# Patient Record
Sex: Female | Born: 1970
Health system: Southern US, Community
[De-identification: ages and names within clinical notes are randomized; demographics above are authoritative.]

## PROBLEM LIST (undated history)

## (undated) DIAGNOSIS — Z8489 Family history of other specified conditions: Secondary | ICD-10-CM

## (undated) DIAGNOSIS — R51 Headache: Secondary | ICD-10-CM

## (undated) DIAGNOSIS — E0590 Thyrotoxicosis, unspecified without thyrotoxic crisis or storm: Secondary | ICD-10-CM

## (undated) DIAGNOSIS — I10 Essential (primary) hypertension: Secondary | ICD-10-CM

## (undated) DIAGNOSIS — R519 Headache, unspecified: Secondary | ICD-10-CM

## (undated) HISTORY — PX: PARTIAL HYSTERECTOMY: SHX80

## (undated) HISTORY — PX: CHOLECYSTECTOMY: SHX55

## (undated) HISTORY — PX: ABDOMINAL HYSTERECTOMY: SHX81

---

## 2008-03-12 HISTORY — PX: ENDOMETRIAL ABLATION: SHX621

## 2009-12-01 ENCOUNTER — Encounter: Payer: Self-pay | Admitting: Family Medicine

## 2009-12-01 LAB — CONVERTED CEMR LAB
HDL: 50 mg/dL
LDL Cholesterol: 44 mg/dL

## 2009-12-08 LAB — CONVERTED CEMR LAB: Pap Smear: NORMAL

## 2009-12-26 ENCOUNTER — Ambulatory Visit: Payer: Self-pay | Admitting: Family Medicine

## 2009-12-26 DIAGNOSIS — G43009 Migraine without aura, not intractable, without status migrainosus: Secondary | ICD-10-CM | POA: Insufficient documentation

## 2009-12-26 LAB — CONVERTED CEMR LAB: T4, Total: 8.3 ug/dL (ref 5.0–12.5)

## 2009-12-27 LAB — CONVERTED CEMR LAB
T3, Free: 2.9 pg/mL (ref 2.3–4.2)
TSH: 0.56 microintl units/mL (ref 0.35–5.50)

## 2010-03-08 ENCOUNTER — Ambulatory Visit: Payer: Self-pay | Admitting: Family Medicine

## 2010-03-08 DIAGNOSIS — H6121 Impacted cerumen, right ear: Secondary | ICD-10-CM | POA: Insufficient documentation

## 2010-03-08 DIAGNOSIS — H6120 Impacted cerumen, unspecified ear: Secondary | ICD-10-CM | POA: Insufficient documentation

## 2010-04-11 NOTE — Assessment & Plan Note (Signed)
Summary: NEW PT TO ESTABH/DLO   Vital Signs:  Patient profile:   40 year old female Height:      63 inches Weight:      123.0 pounds BMI:     21.87 Temp:     98.6 degrees F oral Pulse rate:   76 / minute Pulse rhythm:   regular BP sitting:   110 / 78  (left arm) Cuff size:   regular  Vitals Entered By: Benny Lennert CMA Duncan Dull) (December 26, 2009 11:10 AM)  History of Present Illness: Chief complaint new patient to be established.  40 yo healthy female with no complaints. Brings in her labs from her health assessment at work.  LDL44, HDL 50, TG 86.  Was told that she needed to monitor her thyroid function several years ago.  ?thyroiditis. Denies any palpitations, cold or hot intolerance.  No trouble swallowing or pain with swallowing.  No constipation or diarrhea.  Otherwise very healthy.  Stays active, uses the gym in office 3 times per week.    Preventive Screening-Counseling & Management  Alcohol-Tobacco     Smoking Status: never      Drug Use:  no.    Current Medications (verified): 1)  None  Allergies (verified): No Known Drug Allergies  Past History:  Family History: Last updated: 12/26/2009 Family History Breast cancer 1st degree relative <50  Social History: Last updated: 12/26/2009 Compliance consultant for [employer] Married 2 children Never Smoked Alcohol use-no Drug use-no  Risk Factors: Smoking Status: never (12/26/2009)  Past Medical History: Current Problems:  MIGRAINE, COMMON (ICD-346.10)    Past Surgical History: gallbladder removal  Family History: Family History Breast cancer 1st degree relative <50  Social History: Compliance Research scientist (medical) for [employer] Married 2 children Never Smoked Alcohol use-no Drug use-no Smoking Status:  never Drug Use:  no  Review of Systems      See HPI General:  Denies malaise. Eyes:  Denies blurring. ENT:  Denies difficulty swallowing. CV:  Denies chest pain or discomfort and palpitations. Resp:   Denies shortness of breath. GI:  Denies abdominal pain and change in bowel habits. GU:  Denies abnormal vaginal bleeding and discharge. MS:  Denies joint pain, joint redness, and joint swelling. Derm:  Denies rash. Neuro:  Denies weakness. Psych:  Denies anxiety and depression. Endo:  Denies cold intolerance and heat intolerance.  Physical Exam  General:  alert, well-developed, and well-nourished.   Head:  normocephalic and atraumatic.   Eyes:  vision grossly intact, pupils equal, pupils round, and pupils reactive to light.   Ears:  R ear normal and L ear normal.   Nose:  no external deformity.   Mouth:  good dentition and no gingival abnormalities.   Neck:  supple and no masses.   thyroid does feel full, nontender. Lungs:  Normal respiratory effort, chest expands symmetrically. Lungs are clear to auscultation, no crackles or wheezes. Heart:  Normal rate and regular rhythm. S1 and S2 normal without gallop, murmur, click, rub or other extra sounds. Abdomen:  Bowel sounds positive,abdomen soft and non-tender without masses, organomegaly or hernias noted. Msk:  No deformity or scoliosis noted of thoracic or lumbar spine.   Extremities:  no edema Neurologic:  alert & oriented X3 and gait normal.   Skin:  Intact without suspicious lesions or rashes Psych:  Cognition and judgment appear intact. Alert and cooperative with normal attention span and concentration. No apparent delusions, illusions, hallucinations   Impression & Recommendations:  Problem # 1:  ?  of HYPERTHYROIDISM (ICD-242.90) recheck thyroid studies today.   Orders: Venipuncture (16109) TLB-TSH (Thyroid Stimulating Hormone) (84443-TSH) T-T3, Free [phone]) T-T4, Thyroxine; Total [phone]) Specimen Handling (13086) TLB-T3, Free (Triiodothyronine) (84481-T3FREE)  Problem # 2:  Preventive Health Care (ICD-V70.0) Reviewed preventive care protocols, scheduled due services, and updated immunizations Discussed  nutrition, exercise, diet, and healthy lifestyle.   Orders Added: 1)  Venipuncture [36415] 2)  TLB-TSH (Thyroid Stimulating Hormone) [84443-TSH] 3)  T-T3, Free [57846-96295] 4)  T-T4, Thyroxine; Total [84436-23265] 5)  Specimen Handling [99000] 6)  TLB-T3, Free (Triiodothyronine) [28413-K4MWNU] 7)  New Patient 18-39 years [99385]    Prior Medications (reviewed today): None Current Allergies (reviewed today): No known allergies  HDL Result Date:  12/01/2009 HDL Result:  50 LDL Result Date:  12/01/2009 LDL Result:  44 PAP Result Date:  12/08/2009 PAP Result:  normal- historical mammogram 2008    Past Medical History:    Current Problems:     MIGRAINE, COMMON (ICD-346.10)       Past Surgical History:    gallbladder removal

## 2010-04-13 NOTE — Assessment & Plan Note (Signed)
Summary: WANTS EARS FLUSHED OUT   Vital Signs:  Patient profile:   40 year old female Height:      63 inches Weight:      126 pounds BMI:     22.40 Temp:     98.7 degrees F oral Pulse rate:   76 / minute Pulse rhythm:   regular BP sitting:   120 / 72  (right arm) Cuff size:   regular  Vitals Entered By: Linde Gillis CMA Duncan Dull) (March 08, 2010 11:52 AM) CC: ear irrigation   History of Present Illness: 40 yo here for bilateral cerumen impaction for several weeks. No ear pain has had felt pressure.  Tried getting wax out with qtip, did not work. No rining in her ears, has noticed decreased hearing , right>left.  Current Medications (verified): 1)  None  Allergies (verified): No Known Drug Allergies  Review of Systems      See HPI ENT:  Complains of decreased hearing; denies ear discharge, earache, sinus pressure, and sore throat.  Physical Exam  General:  alert, well-developed, and well-nourished.   Ears:  bilateral ears - cerumen impactions, s/p removal with curette and irrigation, TMS clear bilaterally.  Psych:  Cognition and judgment appear intact. Alert and cooperative with normal attention span and concentration. No apparent delusions, illusions, hallucinations   Impression & Recommendations:  Problem # 1:  CERUMEN IMPACTION, BILATERAL (ICD-380.4)  cerumen removed, pt tolerated procedure well.  Orders: Cerumen Impaction Removal (16109)   Orders Added: 1)  Cerumen Impaction Removal [69210]    Prior Medications (reviewed today): None Current Allergies (reviewed today): No known allergies

## 2010-12-04 ENCOUNTER — Ambulatory Visit (INDEPENDENT_AMBULATORY_CARE_PROVIDER_SITE_OTHER): Payer: BC Managed Care – PPO | Admitting: Family Medicine

## 2010-12-04 ENCOUNTER — Encounter: Payer: Self-pay | Admitting: *Deleted

## 2010-12-04 ENCOUNTER — Encounter: Payer: Self-pay | Admitting: Family Medicine

## 2010-12-04 VITALS — BP 130/72 | HR 72 | Temp 98.7°F | Wt 125.5 lb

## 2010-12-04 DIAGNOSIS — J329 Chronic sinusitis, unspecified: Secondary | ICD-10-CM

## 2010-12-04 MED ORDER — FLUTICASONE PROPIONATE 50 MCG/ACT NA SUSP: 1.0000 | Freq: Every day | NASAL | Status: DC

## 2010-12-04 MED ORDER — AMOXICILLIN 500 MG PO CAPS: 500.0000 mg | ORAL_CAPSULE | Freq: Two times a day (BID) | ORAL | Status: AC

## 2010-12-04 NOTE — Progress Notes (Signed)
SUBJECTIVE:  Kristin Hunter is a 40 y.o. female who complains of coryza, congestion, sore throat, nasal blockage, post nasal drip, headache and bilateral sinus pain for 7 days. She denies a history of chest pain and denies a history of asthma. Patient denies smoke cigarettes.   OBJECTIVE: BP 130/72  Pulse 72  Temp(Src) 98.7 F (37.1 C) (Oral)  Wt 125 lb 8 oz (56.926 kg)  LMP 11/27/2010  She appears well, vital signs are as noted. Ears normal.  Throat and pharynx normal.  Neck supple. No adenopathy in the neck. Nose is congested. Sinuses TTP throughout. The chest is clear, without wheezes or rales.  ASSESSMENT:  viral upper respiratory illness and sinusitis  PLAN: Given duration and progression of symptoms, will treat for bacterial sinusitis with amoxicillin, flonase. Symptomatic therapy suggested: push fluids, rest and return office visit prn if symptoms persist or worsen.  Call or return to clinic prn if these symptoms worsen or fail to improve as anticipated.

## 2010-12-04 NOTE — Patient Instructions (Signed)
Good to see you. Take antibiotic as directed.  Drink lots of fluids.  Treat sympotmatically with Mucinex, nasal saline irrigation,flonase and Tylenol/IbuprofenYou can use warm compresses.  Cough suppressant at night. Call if not improving as expected in 5-7 days.

## 2010-12-05 ENCOUNTER — Ambulatory Visit: Payer: Self-pay | Admitting: Obstetrics and Gynecology

## 2010-12-11 ENCOUNTER — Telehealth: Payer: Self-pay | Admitting: *Deleted

## 2010-12-11 NOTE — Telephone Encounter (Signed)
Pt was seen a month ago for a sinus infection and given flonase and amox.  She was using afrin and you had told her to try and wean herself off of it. She has been on it for 8 days now and has been unable to stop it.  She still has a lot of drainage in her throat and sinus inflammation, says if she doesn't use the afrin she will stop up completely.  She has surgery coming up on the 15th and she wants to be off all her meds by then.

## 2010-12-12 NOTE — Telephone Encounter (Signed)
I wish there was something I could do to help with this but really all she can do is try to wean off of it.  If she cannot, she needs to let anesthesiologist know.

## 2010-12-12 NOTE — Telephone Encounter (Signed)
She is unable to stop the Afrin nasal spray because it she stops it she will be completely stopped up.  Since she is having surgery she needs to be off all of her medication but what should she do because the Afrin is really helping her?  Please advise.

## 2010-12-12 NOTE — Telephone Encounter (Signed)
Patient advised as instructed via telephone. 

## 2010-12-12 NOTE — Telephone Encounter (Signed)
I'm not quite sure what she is asking? 

## 2010-12-25 ENCOUNTER — Ambulatory Visit: Payer: Self-pay | Admitting: Obstetrics and Gynecology

## 2010-12-27 LAB — PATHOLOGY REPORT

## 2011-04-05 ENCOUNTER — Encounter: Payer: Self-pay | Admitting: Family Medicine

## 2011-04-05 ENCOUNTER — Ambulatory Visit (INDEPENDENT_AMBULATORY_CARE_PROVIDER_SITE_OTHER): Payer: BC Managed Care – PPO | Admitting: Family Medicine

## 2011-04-05 VITALS — BP 120/80 | HR 98 | Temp 98.6°F | Wt 129.2 lb

## 2011-04-05 DIAGNOSIS — IMO0001 Reserved for inherently not codable concepts without codable children: Secondary | ICD-10-CM

## 2011-04-05 DIAGNOSIS — K219 Gastro-esophageal reflux disease without esophagitis: Secondary | ICD-10-CM

## 2011-04-05 DIAGNOSIS — R079 Chest pain, unspecified: Secondary | ICD-10-CM

## 2011-04-05 NOTE — Patient Instructions (Addendum)
Avoid Aspirin, NSAID's (like Advil or Motrin), caffeine, peppermints, alcohol and tobacco. Pepcid and/or antacids are often very helpful for as needed use. I would go ahead and take Pepcid 20 mg daily for next 2 weeks. Keep me posted- if symptoms do not improve or get worse, please let me know immediately.

## 2011-04-05 NOTE — Progress Notes (Signed)
SUBJECTIVE: Kristin Hunter is a 41 y.o. female who complains of GERD type symptoms. She has been experiencing fullness after meals, belching and eructation, abdominal bloating, heartburn, bilious reflux, nocturnal burning, symptoms occur at night, chest pain for many week(s), intermittent over time. ROS: patient denies abdominal pain, weight loss, dysphagia, black stools, hematemesis, diarrhea, constipation, fever, history of PUD or history of GI bleeding. Social history: no or minimal alcohol, nonsmoker, no or mild caffeine use, no ASA or NSAID's.  Remote h/o cholecystectomy.  Two nights ago, was woken up by bilious reflux and chest pain.  No radiation of symptoms. No diaphoresis, nausea or vomiting.  Beano helped resolve symptoms and went back to bed without issue.  She has been trying to eat more vegetables.  Used to eat broccoli once a week, now eats several times per day.  Current Outpatient Prescriptions  Medication Sig Dispense Refill  . fluticasone (FLONASE) 50 MCG/ACT nasal spray Place 1 spray into the nose daily.  16 g  2   Patient Active Problem List  Diagnoses  . MIGRAINE, COMMON  . CERUMEN IMPACTION, BILATERAL  . Sinusitis   No past medical history on file. No past surgical history on file. History  Substance Use Topics  . Smoking status: Never Smoker   . Smokeless tobacco: Not on file  . Alcohol Use: Not on file   No family history on file. No Known Allergies  The PMH, PSH, Social History, Family History, Medications, and allergies have been reviewed in [facility], and have been updated if relevant.    OBJECTIVE:  BP 120/80  Pulse 98  Temp(Src) 98.6 F (37 C) (Oral)  Wt 129 lb 4 oz (58.627 kg)  LMP 11/27/2010 Wt Readings from Last 3 Encounters:  04/05/11 129 lb 4 oz (58.627 kg)  12/04/10 125 lb 8 oz (56.926 kg)  03/08/10 126 lb (57.153 kg)     Appears well, alert and oriented x 3, pleasant cooperative in NAD. Anicteric. Vitals as noted. Neck free of  lymphadenopathy or mass. Abdomen - abdomen is soft without significant tenderness, masses, organomegaly or guarding..  EKG:  NSR   ASSESSMENT: GERD   PLAN: The pathophysiology of reflux is discussed.  Anti-reflux measures such as raising the head of the bed, avoiding tight clothing or belts, avoiding eating late at night and not lying down shortly after mealtime and achieving weight loss are discussed. Avoid ASA, NSAID's, caffeine, peppermints, alcohol and tobacco. OTC H2 blockers and/or antacids are often very helpful for PRN use. However, for persisting chronic or daily symptoms, prescription strength H2 blockers or a trial of PPI's are often used. Further recommendations to her:  She should alert me if there are persistent symptoms, dysphagia, weight loss or GI bleeding.

## 2011-11-16 ENCOUNTER — Ambulatory Visit (INDEPENDENT_AMBULATORY_CARE_PROVIDER_SITE_OTHER): Payer: BC Managed Care – PPO | Admitting: Family Medicine

## 2011-11-16 ENCOUNTER — Encounter: Payer: Self-pay | Admitting: Family Medicine

## 2011-11-16 VITALS — BP 120/72 | HR 84 | Temp 98.4°F | Wt 125.0 lb

## 2011-11-16 DIAGNOSIS — R350 Frequency of micturition: Secondary | ICD-10-CM | POA: Insufficient documentation

## 2011-11-16 DIAGNOSIS — R300 Dysuria: Secondary | ICD-10-CM | POA: Insufficient documentation

## 2011-11-16 LAB — POCT URINALYSIS DIPSTICK
Bilirubin, UA: NEGATIVE
Blood, UA: NEGATIVE
Glucose, UA: NEGATIVE
Ketones, UA: NEGATIVE
Leukocytes, UA: NEGATIVE
Nitrite, UA: NEGATIVE
Protein, UA: NEGATIVE
Spec Grav, UA: 1.02
Urobilinogen, UA: NEGATIVE
pH, UA: 6

## 2011-11-16 LAB — COMPREHENSIVE METABOLIC PANEL
ALT: 7 U/L (ref 0–35)
AST: 14 U/L (ref 0–37)
Albumin: 4.3 g/dL (ref 3.5–5.2)
Alkaline Phosphatase: 50 U/L (ref 39–117)
BUN: 14 mg/dL (ref 6–23)
CO2: 23 mEq/L (ref 19–32)
Calcium: 9.2 mg/dL (ref 8.4–10.5)
Chloride: 106 mEq/L (ref 96–112)
Creatinine, Ser: 0.6 mg/dL (ref 0.4–1.2)
GFR: 110.27 mL/min (ref >60.00)
Glucose, Bld: 93 mg/dL (ref 70–99)
Potassium: 3.7 mEq/L (ref 3.5–5.1)
Sodium: 138 mEq/L (ref 135–145)
Total Bilirubin: 0.6 mg/dL (ref 0.3–1.2)
Total Protein: 7.9 g/dL (ref 6.0–8.3)

## 2011-11-16 LAB — TSH: TSH: 0.73 u[IU]/mL (ref 0.35–5.50)

## 2011-11-16 NOTE — Patient Instructions (Addendum)
It was great to see you. Have a wonderful weekend. We will call you with your lab results. Please stop by to see Shirlee Limerick on your way out to set up your urology referral.

## 2011-11-16 NOTE — Progress Notes (Signed)
SUBJECTIVE: Kristin Hunter is a 41 y.o. female who complains of urinary frequency for years most recently worsened past few weeks.  OGYN though it would improve after her hysterectomy.  It seemed to help for a while but she still has these recurrent episodes.  Not associated with dysuria, back pain, nausea, vomiting, hematuria or fevers.  Admits to drinking a lot tea.  Patient Active Problem List  Diagnosis  . MIGRAINE, COMMON  . CERUMEN IMPACTION, BILATERAL  . Sinusitis  . Dysuria   No past medical history on file. No past surgical history on file. History  Substance Use Topics  . Smoking status: Never Smoker   . Smokeless tobacco: Not on file  . Alcohol Use: Not on file   No family history on file. No Known Allergies No current outpatient prescriptions on file prior to visit.   The PMH, PSH, Social History, Family History, Medications, and allergies have been reviewed in [facility], and have been updated if relevant.  OBJECTIVE: BP 120/72  Pulse 84  Temp 98.4 F (36.9 C)  Wt 125 lb (56.7 kg)  LMP 11/27/2010   Appears well, in no apparent distress.  Vital signs are normal. The abdomen is soft without tenderness, guarding, mass, rebound or organomegaly. No CVA tenderness or inguinal adenopathy noted. Urine dipstick shows negative for all components.  Micro exam: not done.   ASSESSMENT/PLAN:  1. Urinary frequency  Hemoglobin A1c, Comprehensive metabolic panel, TSH, Ambulatory referral to Urology  Deteriorated- neg UA. Discussed importance of cutting out bladder irritants such as caffeine. Check blood work today to rule out other possible contributing factors. Refer to urology for possible cystoscopy, ? IC. The patient indicates understanding of these issues and agrees with the plan.

## 2011-11-19 LAB — HEMOGLOBIN A1C: Hgb A1c MFr Bld: 5.3 % (ref 4.6–6.5)

## 2012-03-07 ENCOUNTER — Encounter: Payer: Self-pay | Admitting: Family Medicine

## 2012-03-10 ENCOUNTER — Other Ambulatory Visit: Payer: Self-pay | Admitting: *Deleted

## 2012-03-27 ENCOUNTER — Telehealth: Payer: Self-pay | Admitting: Family Medicine

## 2012-03-27 DIAGNOSIS — N6310 Unspecified lump in the right breast, unspecified quadrant: Secondary | ICD-10-CM

## 2012-03-27 NOTE — Telephone Encounter (Signed)
Patient had MMG at [facility] [place] She had Addl views right breast done on 03/21/12 and results are in your in box. They are requesting an order from you for a 3 Month FU Right Breast MMG with Right Breast US if needed for Breast Density. I got the patient set up for her FU MMG on 06/19/2012 at 10:30 and she is aware. I will fax your order to [facility] , [phone]. Thank You!

## 2012-03-28 NOTE — Telephone Encounter (Signed)
Order placed. Thank you.

## 2012-03-28 NOTE — Telephone Encounter (Signed)
Order Faxed to [facility] in [place]

## 2012-04-01 ENCOUNTER — Encounter: Payer: Self-pay | Admitting: Family Medicine

## 2012-06-19 ENCOUNTER — Encounter: Payer: Self-pay | Admitting: Family Medicine

## 2012-07-10 ENCOUNTER — Telehealth: Payer: Self-pay

## 2012-07-10 DIAGNOSIS — R928 Other abnormal and inconclusive findings on diagnostic imaging of breast: Secondary | ICD-10-CM

## 2012-07-10 NOTE — Telephone Encounter (Signed)
Referral placed.

## 2012-07-10 NOTE — Telephone Encounter (Signed)
Pt left v/m confirming Dr Dayton Martes received 2nd mammogram done in 06/2012;(result under Media tab). Pt also needs referral and appt for 6 months for right diagnositic mammogram and [place] at [facility] in [place]. [facility] fax # [phone].Please advise.

## 2012-07-11 ENCOUNTER — Encounter: Payer: Self-pay | Admitting: Family Medicine

## 2012-07-11 ENCOUNTER — Telehealth: Payer: Self-pay | Admitting: Family Medicine

## 2012-07-11 ENCOUNTER — Ambulatory Visit (INDEPENDENT_AMBULATORY_CARE_PROVIDER_SITE_OTHER): Payer: BC Managed Care – PPO | Admitting: Family Medicine

## 2012-07-11 VITALS — BP 120/80 | HR 80 | Temp 98.1°F | Wt 128.0 lb

## 2012-07-11 DIAGNOSIS — N951 Menopausal and female climacteric states: Secondary | ICD-10-CM

## 2012-07-11 DIAGNOSIS — R232 Flushing: Secondary | ICD-10-CM | POA: Insufficient documentation

## 2012-07-11 NOTE — Patient Instructions (Addendum)
Wonderful to see you. Have a great weekend. We will call you with your lab results.

## 2012-07-11 NOTE — Telephone Encounter (Signed)
Patient needs a follow up mammogram per the breast center she went to in 6 months. Please call the patient.  She states we should have the records from her most recent mammogram.

## 2012-07-11 NOTE — Progress Notes (Signed)
  Subjective:    Patient ID: Kristin Hunter, female    DOB: June 27, 1970, 42 y.o.   MRN: [id number]  HPI  42 yo pleasant female here for ? Thyroid issues.  Was seeing her OBGYN, Dr. Logan Bores, for hot flashes.  Pt states he told her it may be a thyroid issue.  S/p hysterectomy last year for fibroids.  Does still have her ovaries.  Past several weeks, daily hot flashes that last minutes at a time. She is having night sweats as well.  Denies any changes in her bowels, hair or skin.  No palpitations or fatigue.     Patient Active Problem List   Diagnosis Date Noted  . Hot flashes 07/11/2012   No past medical history on file. No past surgical history on file. History  Substance Use Topics  . Smoking status: Never Smoker   . Smokeless tobacco: Not on file  . Alcohol Use: Not on file   No family history on file. No Known Allergies No current outpatient prescriptions on file prior to visit.   No current facility-administered medications on file prior to visit.   The PMH, PSH, Social History, Family History, Medications, and allergies have been reviewed in [facility], and have been updated if relevant.    Review of Systems See HPI    Objective:   Physical Exam BP 120/80  Pulse 80  Temp(Src) 98.1 F (36.7 C)  Wt 128 lb (58.06 kg)  BMI 22.68 kg/m2  LMP 11/27/2010  General:  Well-developed,well-nourished,in no acute distress; alert,appropriate and cooperative throughout examination Head:  normocephalic and atraumatic.   Eyes:  vision grossly intact, pupils equal, pupils round, and pupils reactive to light.   Ears:  R ear normal and L ear normal.   Nose:  no external deformity.   Mouth:  good dentition.   Neck:  Thyroid symmetrically slightly large, no nodules, NTTP Breasts:  No mass, nodules, thickening, tenderness, bulging, retraction, inflamation, nipple discharge or skin changes noted.   Lungs:  Normal respiratory effort, chest expands symmetrically. Lungs are clear to  auscultation, no crackles or wheezes. Heart:  Normal rate and regular rhythm. S1 and S2 normal without gallop, murmur, click, rub or other extra sounds. Abdomen:  Bowel sounds positive,abdomen soft and non-tender without masses, organomegaly or hernias noted. Msk:  No deformity or scoliosis noted of thoracic or lumbar spine.   Extremities:  No clubbing, cyanosis, edema, or deformity noted with normal full range of motion of all joints.   Neurologic:  alert & oriented X3 and gait normal.   Skin:  Intact without suspicious lesions or rashes Cervical Nodes:  No lymphadenopathy noted Axillary Nodes:  No palpable lymphadenopathy Psych:  Cognition and judgment appear intact. Alert and cooperative with normal attention span and concentration. No apparent delusions, illusions, hallucinations        Assessment & Plan:  1. Hot flashes ?perimenopausal.  She does have enlarged thyroid.  Will check LH/FSH and thyroid studies. The patient indicates understanding of these issues and agrees with the plan.  - TSH - T4, Free - Luteinizing hormone - Follicle Stimulating Hormone

## 2012-07-11 NOTE — Telephone Encounter (Signed)
Appt made with [facility] on 12/23/12 at 9:45am. [facility] Patient notified and orderds faxed to [facility] at [phone]

## 2012-07-12 LAB — TSH: TSH: 0.816 u[IU]/mL (ref 0.350–4.500)

## 2012-07-12 LAB — LUTEINIZING HORMONE: LH: 26.4 m[IU]/mL

## 2012-07-12 LAB — T4, FREE: Free T4: 1.15 ng/dL (ref 0.80–1.80)

## 2012-07-12 LAB — FOLLICLE STIMULATING HORMONE: FSH: 62.8 m[IU]/mL

## 2012-08-12 ENCOUNTER — Telehealth: Payer: Self-pay | Admitting: Family Medicine

## 2012-08-12 NOTE — Telephone Encounter (Signed)
Patient Information:  Caller Name: Catrina  Phone: [phone]  Patient: Kristin Hunter, Kristin Hunter  Gender: Female  DOB: 09/10/1970  Age: 42 Years  PCP: Ruthe Mannan [facility])  Pregnant: No  Office Follow Up:  Does the office need to follow up with this patient?: No  Instructions For The Office: N/A  RN Note:  Diarrhea started the day after she returned from the [place] (resort vacation), Saturday, 08/09/2012. Diarrhea approximately 5X/day for the last 3 days, all liquid. ADL's effected: waking up from, she is still eating and drinking normally. RN/CAN advised appointment and transferred to the office per her request for an appointment tomorrow, 08/13/2012 at noon. Appointment was successfully made and RN/CAN suggested if she can bring in a stool sample and keep in refrigerator until appointment.  Symptoms  Reason For Call & Symptoms: [place] trip for a week and started with Diarrhea the day after coming home.  Reviewed Health History In EMR: Yes  Reviewed Medications In EMR: Yes  Reviewed Allergies In EMR: Yes  Reviewed Surgeries / Procedures: Yes  Date of Onset of Symptoms: 08/09/2012  Treatments Tried: Pepperment tea  Treatments Tried Worked: No OB / GYN:  LMP: Unknown  Guideline(s) Used:  Diarrhea  Disposition Per Guideline:   Callback by PCP Today  Reason For Disposition Reached:   Travel to a foreign country in past month  Advice Given:  Reassurance:  In healthy adults, new-onset diarrhea is usually caused by a viral infection of the intestines, which you can treat at home. Diarrhea is the body's way of getting rid of the infection. Here are some tips on how to keep ahead of the fluid losses.  Fluids:  Drink more fluids, at least 8-10 glasses (8 oz or 240 ml) daily.  For example: sports drinks, diluted fruit juices, soft drinks.  Supplement this with saltine crackers or soups to make certain that you are getting sufficient fluid and salt to meet your  body's needs.  Nutrition:  Maintaining some food intake during episodes of diarrhea is important.  Ideal initial foods include boiled starches/cereals (e.g., potatoes, rice, noodles, wheat, oats) with a small amount of salt to taste.  Other acceptable foods include: bananas, yogurt, crackers, soup.  As your stools return to normal consistency, resume a normal diet.  Expected Course:  Viral diarrhea lasts 4-7 days. Always worse on days 1 and 2.  Call Back If:  Signs of dehydration occur (e.g., no urine for more than 12 hours, very dry mouth, lightheaded, etc.)  Diarrhea lasts over 7 days  You become worse.  RN Overrode Recommendation:  Make Appointment  Appointment made for tomorrow, 08/13/2012 @ lunch.

## 2012-08-12 NOTE — Telephone Encounter (Signed)
Will see tomorrow

## 2012-08-13 ENCOUNTER — Encounter: Payer: Self-pay | Admitting: Family Medicine

## 2012-08-13 ENCOUNTER — Ambulatory Visit (INDEPENDENT_AMBULATORY_CARE_PROVIDER_SITE_OTHER): Payer: BC Managed Care – PPO | Admitting: Family Medicine

## 2012-08-13 VITALS — BP 126/78 | HR 84 | Temp 98.5°F | Wt 125.5 lb

## 2012-08-13 DIAGNOSIS — A09 Infectious gastroenteritis and colitis, unspecified: Secondary | ICD-10-CM

## 2012-08-13 MED ORDER — CIPROFLOXACIN HCL 500 MG PO TABS: 500.0000 mg | ORAL_TABLET | Freq: Two times a day (BID) | ORAL | Status: DC

## 2012-08-13 NOTE — Patient Instructions (Signed)
I think you do have traveler's diarrhea. Treat with cipro twice daily for 3 days Let us know if not improving with this.  Travelers' Diarrhea Travelers' diarrhea (TD) is the most common illness affecting travelers. Each year many travelers develop diarrhea. TD usually occurs within the first week of travel. However, it may occur at any time while traveling. It may even occur after returning home. The most important risk factor is where you are going. High-risk places are the developing countries of:  [place].  [place].  [place].  [place]. High risk people include young adults and those with:  Transplants.  HIV infections.  Medicine that suppresses the immune system.  Inflammatory-bowel disease.  Diabetes.  H-2 blockers or antacids. Attack rates are similar for men and women. The primary source of TD is eating or drinking food or water tainted with feces (stool or bowel movements). CAUSES  Infectious agents are the primary cause of TD. Germs cause almost 80% of TD cases. The most common germ produces:  Watery diarrhea with cramps.  Low-grade or no fever. There are many other bacterial, viral and parasitic pathogens (disease causing "bugs").  SYMPTOMS  Most TD cases begin suddenly. Symptoms include stool that is increased in:  Frequency.  Volume.  Weight. Altered stool consistency also is common. Typically, you have four to five loose or watery bowel movements each day. Other common symptoms are:  Nausea.  Vomiting.  Diarrhea.  Abdominal cramping.  Bloating.  Fever.  Urgency.  Malaise. Most cases are not dangerous. Most cases go away in 1-2 days without treatment. TD is rarely life threatening. 90% of cases resolve within 1 week. 98% resolve within 1 month. PREVENTION   Avoid foods or beverages purchased from street vendors in high risk countries.  Avoid food from places where unclean conditions are present.  Avoid raw or undercooked meat and  seafood.  Avoid raw fruits (e.g., oranges, bananas, avocados) and vegetables unless you peel them yourself.  If handled properly, well-cooked and packaged foods usually are safe. Foods associated with increased risk for TD include:  Tap water.  Ice.  Unpasteurized milk.  Dairy products.  Safe beverages include:  Bottled carbonated beverages.  Hot tea or coffee.  Beer.  Wine.  Boiled water.  Water treated with iodine or chlorine. ANTIBIOTICS ARE NOT RECOMMENDED AS PREVENTION  CDC (Centers for Disease Control) does not recommend antimicrobial drugs (medicine that kill germs) to prevent TD. Several studies show that Pepto-Bismol taken as either 2 tablets 4 times daily, or 2 fluid ounces 4 times daily, reduces the incidence of travelers' diarrhea. People that should avoid Pepto-Bismol include those who are:  Pregnant.  Allergic to aspirin.  Taking anticoagulants medicine (probenecid, methotrexate).  Be informed about potential side effects, in particular about temporary blackening of the tongue and stool, and rarely ringing in the ears. Because of potential adverse side effects, preventative Pepto-Bismol should not be used for more than 3 weeks.  Some antibiotics taken in a once-a-day dose are 90% effective at preventing travelers' diarrhea. However, antibiotics are not recommended as prevention. Routine antimicrobial prophylaxis increases your risk for:  Adverse reactions.  Infections with resistant organisms.  Antibiotics can increase your susceptibility to resistant bacterial pathogens and provide no protection against either viral or parasitic pathogens. This can give travelers a false sense of security. As a result, strict adherence to preventive measures is encouraged. Pepto-Bismol should be used as an extra effort if prophylaxis is needed. TREATMENT   TD usually is  a self-limited disorder. It gets well without treatment. It often goes away without specific  treatment. Oral re-hydration is often helpful to replace lost fluids and electrolytes. Clear liquids are routinely recommended for adults. You may be helped with antimicrobial therapy if you develop three or more loose stools in an 8-hour period, especially if associated with:  Nausea.  Vomiting.  Abdominal cramps.  Fever.  Blood in stools.  Antibiotics usually are given for 3-5 days. Pepto-Bismol also may be used as treatment. Take one fluid ounce, or two 262 mg tablets every 30 minutes, for up to 8 doses in a 24-hour period. This can be repeated on a second day. If diarrhea persists despite therapy, you should be evaluated by a caregiver and treated for possible parasitic infection.  Because drug resistance is a continuing problem and may vary from country to country, professional assistance should be looked for if problems persist.  Antimotility agents (loperamide, diphenoxylate, and paregoric) mostly reduce diarrhea by slowing down the passage of food and drink in the gut. This allows more time for absorption. Some persons believe diarrhea is the body's defense mechanism to minimize contact time between gut pathogens and lining of the bowel. In several studies, antimotility agents have been useful in treating travelers' diarrhea by decreasing the duration of diarrhea. However, these agents should never be used by persons with fever or bloody diarrhea because they can increase the severity of disease by delaying clearance of causative organisms. Because antimotility agents are now available over the counter, their improper use is of concern. Complications have been reported from the use of these medicines such as:  Toxic megacolon.  Sepsis.  Disseminated intravascular coagulation. SEEK IMMEDIATE MEDICAL CARE IF:   You are unable to keep fluids down.  Vomiting or diarrhea becomes persistent.  Abdominal (belly) pain develops or increases or localizes. (Right sided pain can be  appendicitis and left sided pain in adults can be diverticulitis).  You develop an oral temperature above 102 F (38.9 C), or as your caregiver suggests.  Diarrhea becomes excessive or contains blood or mucous.  Excessive weakness, dizziness, fainting or extreme thirst.  Checking weight 2 to 3 times per day in babies and children will help verify adequate fluid replacement. Your caregiver will tell you what loss should concern you or suggest another visit to your personal physician.  Record your weight or your child's weight today. Compare this to your home scale and record all weights and time and date weighed. Try to check weight at the same times every day. Bring this chart to your caregivers if you or your child needs to be seen again. FOR MORE INFORMATION  Travelers should consult with a caregiver before departing on a trip abroad. Information about TD is available from:  Your local or state health departments.  World [employer] writer [facility]). Other information that may be of interest to travelers can be found at the [facility] Travelers' Health homepage at QuestDrive.gl. Document Released: 02/16/2002 Document Revised: 05/21/2011 Document Reviewed: 05/06/2008 [facility] Patient Information 2014 [place], [place].

## 2012-08-13 NOTE — Assessment & Plan Note (Signed)
Good hydration status today.  Given recent overseas trip, will treat as traveler's diarrhea with 3d course cipro 500mg  bid Avoid immodium for now. To return if persistent sxs.

## 2012-08-13 NOTE — Progress Notes (Signed)
  Subjective:    Patient ID: Kristin Hunter, female    DOB: 11/28/1970, 42 y.o.   MRN: [id number]  HPI CC: diarrhea  5d h/o diarrhea after return from [place] - stayed at resort.  Diarrhea 5 days a week, 9 times in the last day.  All liquid stools.  Some abd sharp with diarrhea. Noted 3 lb weight loss.  Trying ot stay hydrated.  No nausea/vomiting, fever, chills, sweats or headaches.  No blood in stool.  Eating and drinking well since this started.  No other sick contacts at home - but husband did have nausea/vomiting/diarrhea while at resort.  No past medical history on file.   Review of Systems Per HPI    Objective:   Physical Exam  Nursing note and vitals reviewed. Constitutional: She appears well-developed and well-nourished. No distress.  HENT:  Mouth/Throat: Oropharynx is clear and moist. No oropharyngeal exudate.  Eyes: Conjunctivae and EOM are normal. Pupils are equal, round, and reactive to light. No scleral icterus.  Neck: Normal range of motion. Neck supple.  Cardiovascular: Normal rate, regular rhythm, normal heart sounds and intact distal pulses.   No murmur heard. Pulmonary/Chest: Effort normal and breath sounds normal. No respiratory distress. She has no wheezes. She has no rales.  Abdominal: Soft. Normal appearance and bowel sounds are normal. She exhibits no distension and no mass. There is no hepatosplenomegaly. There is tenderness (mild) in the periumbilical area, suprapubic area, left upper quadrant and left lower quadrant. There is no rebound, no guarding, no CVA tenderness and negative Murphy's sign.  Musculoskeletal: She exhibits no edema.  Skin: Skin is warm and dry. No rash noted.  Brisk cap refill, good skin turgor       Assessment & Plan:

## 2012-12-23 ENCOUNTER — Encounter: Payer: Self-pay | Admitting: Family Medicine

## 2013-01-05 ENCOUNTER — Encounter: Payer: Self-pay | Admitting: Family Medicine

## 2013-03-25 ENCOUNTER — Encounter: Payer: Self-pay | Admitting: Family Medicine

## 2013-03-25 ENCOUNTER — Ambulatory Visit (INDEPENDENT_AMBULATORY_CARE_PROVIDER_SITE_OTHER): Payer: BC Managed Care – PPO | Admitting: Family Medicine

## 2013-03-25 VITALS — BP 124/68 | HR 78 | Temp 98.1°F | Ht 63.0 in | Wt 131.5 lb

## 2013-03-25 DIAGNOSIS — R059 Cough, unspecified: Secondary | ICD-10-CM

## 2013-03-25 DIAGNOSIS — R05 Cough: Secondary | ICD-10-CM

## 2013-03-25 MED ORDER — BENZONATATE 200 MG PO CAPS: 200.0000 mg | ORAL_CAPSULE | Freq: Three times a day (TID) | ORAL | Status: DC | PRN

## 2013-03-25 NOTE — Assessment & Plan Note (Signed)
Benign exam- reassurance provided. Likely remnants from viral infection. Tessalon as needed for cough, continue to push fluids. Call or return to clinic prn if these symptoms worsen or fail to improve as anticipated. The patient indicates understanding of these issues and agrees with the plan.

## 2013-03-25 NOTE — Progress Notes (Signed)
   Subjective:    Patient ID: Kristin Hunter, female    DOB: 06/28/1970, 43 y.o.   MRN: [id number]  HPI  43 yo here for cough x 13 days.  Started out with scratchy throat, myalgias, no fever.  Went to UC and was told likely viral.  Never had runny nose, sinus pressure, or ear pain.  Feels much better but cough continues to linger.  It is now a dry cough. No CP or SOB.  No n/v/d.  Patient Active Problem List   Diagnosis Date Noted  . Cough 03/25/2013  . Hot flashes 07/11/2012   No past medical history on file. No past surgical history on file. History  Substance Use Topics  . Smoking status: Never Smoker   . Smokeless tobacco: Not on file  . Alcohol Use: No   No family history on file. No Known Allergies No current outpatient prescriptions on file prior to visit.   No current facility-administered medications on file prior to visit.   The PMH, PSH, Social History, Family History, Medications, and allergies have [facility] reviewed [facility], and have been updated if relevant.   Review of Systems  Constitutional: Negative for appetite change and unexpected weight change.  HENT: Negative for congestion, ear pain, facial swelling, postnasal drip, rhinorrhea and sinus pressure.   Respiratory: Positive for cough. Negative for apnea, chest tightness, shortness of breath, wheezing and stridor.   Cardiovascular: Negative for chest pain.  All other systems reviewed and are negative.       Objective:   Physical Exam  Vitals reviewed. Constitutional: She appears well-developed and well-nourished. No distress.  HENT:  Head: Normocephalic and atraumatic.  Eyes: Pupils are equal, round, and reactive to light.  Neck: Normal range of motion. Neck supple.  Cardiovascular: Normal rate and regular rhythm.   Pulmonary/Chest: Effort normal and breath sounds normal. No respiratory distress. She has no wheezes. She has no rales. She exhibits no tenderness.  Skin: Skin is warm and dry. No rash  noted.  Psychiatric: She has a normal mood and affect.          Assessment & Plan:

## 2013-03-25 NOTE — Patient Instructions (Signed)
Good to see you. Your lungs sound great. Take Tessalon perles as needed for cough, tea with honey.  Call me if you're not getting better.

## 2013-03-25 NOTE — Progress Notes (Signed)
Pre-visit discussion using our clinic review tool. No additional management support is needed unless otherwise documented below in the visit note.  

## 2013-09-22 ENCOUNTER — Ambulatory Visit: Payer: BC Managed Care – PPO | Admitting: Internal Medicine

## 2013-09-22 DIAGNOSIS — Z0289 Encounter for other administrative examinations: Secondary | ICD-10-CM

## 2013-11-02 ENCOUNTER — Telehealth: Payer: Self-pay

## 2013-11-02 NOTE — Telephone Encounter (Signed)
Spoke to pt and appt resched for 11/25/13;2wks out

## 2013-11-02 NOTE — Telephone Encounter (Signed)
If normotensive and she feels ok, ok to follow up in 2 weeks.

## 2013-11-02 NOTE — Telephone Encounter (Signed)
Pt left v/m returning call and request cb. 

## 2013-11-02 NOTE — Telephone Encounter (Signed)
Pt left v/m; on 10/30/13 and 10/31/13 BP 160/95 and 150/95; pt was seen at [facility] on 11/01/13 BP was 123/79 and UC physician advised pt should f/u with Dr Dayton Martes; pt has already scheduled appt with Dr Dayton Martes on 11/04/13 but pt wants to know if should monitor BP for 2 weeks then schedule f/u appt with Dr Dayton Martes. Unable to reach pt for further info such as other symptoms, H/A, dizziness etc. Pt request cb.

## 2013-11-04 ENCOUNTER — Ambulatory Visit: Payer: BC Managed Care – PPO | Admitting: Family Medicine

## 2013-11-25 ENCOUNTER — Ambulatory Visit (INDEPENDENT_AMBULATORY_CARE_PROVIDER_SITE_OTHER): Payer: BC Managed Care – PPO | Admitting: Family Medicine

## 2013-11-25 ENCOUNTER — Encounter: Payer: Self-pay | Admitting: Family Medicine

## 2013-11-25 VITALS — BP 130/78 | HR 87 | Temp 98.1°F | Wt 127.2 lb

## 2013-11-25 DIAGNOSIS — Z23 Encounter for immunization: Secondary | ICD-10-CM

## 2013-11-25 DIAGNOSIS — IMO0001 Reserved for inherently not codable concepts without codable children: Secondary | ICD-10-CM | POA: Insufficient documentation

## 2013-11-25 DIAGNOSIS — R030 Elevated blood-pressure reading, without diagnosis of hypertension: Secondary | ICD-10-CM

## 2013-11-25 NOTE — Progress Notes (Signed)
Pre visit review using our clinic review tool, if applicable. No additional management support is needed unless otherwise documented below in the visit note. 

## 2013-11-25 NOTE — Progress Notes (Signed)
   Subjective:   Patient ID: Kristin Hunter, female    DOB: 03/15/1970, 43 y.o.   MRN: [id number]  Kristin Hunter is a pleasant 43 y.o. year old female who presents to clinic today with Follow-up  on 11/25/2013  HPI: ? HTN  Was traveling all week last week.  Came home, exhausted, having hot flashes and felt dizzy.  Checked her BP and it was 180/95.  Then got a headache- has a h/o migraines. No other focal neurological symptoms.  Went to UC next day, was elevated initially but came down to 123/79.  UC doctor advised she keep a log and follow up with me. Brings in BP log with her today- ranging 118/72- 136/90.  Has had no more episodes of dizziness.    Current Outpatient Prescriptions on File Prior to Visit  Medication Sig Dispense Refill  . fluticasone (FLONASE) 50 MCG/ACT nasal spray Place 2 sprays into both nostrils daily.       No current facility-administered medications on file prior to visit.    No Known Allergies  No past medical history on file.  No past surgical history on file.  No family history on file.  History   Social History  . Marital Status: Married    Spouse Name: N/A    Number of Children: N/A  . Years of Education: N/A   Occupational History  . Not on file.   Social History Main Topics  . Smoking status: Never Smoker   . Smokeless tobacco: Not on file  . Alcohol Use: No  . Drug Use: No  . Sexual Activity: Not on file   Other Topics Concern  . Not on file   Social History Narrative  . No narrative on file   The PMH, PSH, Social History, Family History, Medications, and allergies have been reviewed in [facility], and have been updated if relevant.   Review of Systems    See HPI No further HA No slurred speech NO UE weakness NO difficulty with gait Objective:    BP 130/78  Pulse 87  Temp(Src) 98.1 F (36.7 C) (Oral)  Wt 127 lb 4 oz (57.72 kg)  SpO2 98%  LMP 11/27/2010   Physical Exam  Constitutional: She appears well-developed  and well-nourished. No distress.  Cardiovascular: Normal rate and regular rhythm.   Pulmonary/Chest: Effort normal and breath sounds normal. No respiratory distress.  Musculoskeletal: Normal range of motion.  Skin: Skin is warm and dry.  Psychiatric: She has a normal mood and affect. Her behavior is normal. Judgment and thought content normal.          Assessment & Plan:   Elevated blood pressure No Follow-up on file.

## 2013-11-25 NOTE — Assessment & Plan Note (Signed)
Normotensive today. Reassuring log. Reassurance provided today. Continue keeping a log. Call or return to clinic prn if these symptoms worsen or fail to improve as anticipated. The patient indicates understanding of these issues and agrees with the plan.

## 2014-01-07 ENCOUNTER — Encounter: Payer: Self-pay | Admitting: Family Medicine

## 2015-01-20 ENCOUNTER — Telehealth: Payer: Self-pay | Admitting: Family Medicine

## 2015-01-20 NOTE — Telephone Encounter (Signed)
Patient Name: Kristin Hunter DOB: 05/08/1970 Initial Comment Caller States she has a cough, has been going on for about two weeks Nurse Assessment Nurse: Yetta BarreJones, RN, Miranda Date/Time (Eastern Time): 01/20/2015 10:04:07 AM Confirm and document reason for call. If symptomatic, describe symptoms. ---Caller states she has has had hoarseness, post nasal drip, and sore throat off and on for 2 weeks. Denies fever. Has the patient traveled out of the country within the last 30 days? ---No Does the patient have any new or worsening symptoms? ---Yes Will a triage be completed? ---Yes Related visit to physician within the last 2 weeks? ---No Does the PT have any chronic conditions? (i.e. diabetes, asthma, etc.) ---No Did the patient indicate they were pregnant? ---No Guidelines Guideline Title Affirmed Question Affirmed Notes Hoarseness Hoarseness persists > 2 weeks Final Disposition User See PCP When Office is Open (within 3 days) Yetta BarreJones, RN, Miranda Comments Missed nurse call No appts with PCP in recommended time frame. Appt scheduled for tomorrow 11/11 at 12:30 with Dr. Alphonsus SiasLetvak. Referrals REFERRED TO PCP OFFICE

## 2015-01-20 NOTE — Telephone Encounter (Signed)
Will check at OV 

## 2015-01-20 NOTE — Telephone Encounter (Signed)
Pt has appt 01/21/15 at 12:30 pm with Dr Alphonsus SiasLetvak.

## 2015-01-21 ENCOUNTER — Encounter: Payer: Self-pay | Admitting: Internal Medicine

## 2015-01-21 ENCOUNTER — Ambulatory Visit (INDEPENDENT_AMBULATORY_CARE_PROVIDER_SITE_OTHER): Payer: BLUE CROSS/BLUE SHIELD | Admitting: Internal Medicine

## 2015-01-21 VITALS — BP 118/78 | HR 94 | Temp 97.4°F | Wt 132.0 lb

## 2015-01-21 DIAGNOSIS — J040 Acute laryngitis: Secondary | ICD-10-CM | POA: Diagnosis not present

## 2015-01-21 NOTE — Assessment & Plan Note (Signed)
Seems to be self limited viral infection Some better today Mild sinus symptoms Discussed analgesics If increased PND and cough next week--- would try empiric amoxil

## 2015-01-21 NOTE — Progress Notes (Signed)
Pre visit review using our clinic review tool, if applicable. No additional management support is needed unless otherwise documented below in the visit note. 

## 2015-01-21 NOTE — Patient Instructions (Signed)
Please call next week if you have more sinus symptoms like cough and drainage. I recommend acetaminophen if you voice/throat are still bothering you.

## 2015-01-21 NOTE — Progress Notes (Signed)
   Subjective:    Patient ID: Kristin Hunter, female    DOB: 04/26/1970, 44 y.o.   MRN: [id number]  HPI Here due to lingering respiratory infection Started about 2 weeks ago Stuffiness and throat bothering her--some better Voice has been off at times--this is the most prominent feature No clear post nasal drip No cough No fever No SOB Seems some better today  Uses the flonase at night for deviated septum Using honey and lemon tea for her throat--not clearly helpful  Posterior headache No nasal drainage No ear pain  Current Outpatient Prescriptions on File Prior to Visit  Medication Sig Dispense Refill  . fluticasone (FLONASE) 50 MCG/ACT nasal spray Place 2 sprays into both nostrils daily.     No current facility-administered medications on file prior to visit.    No Known Allergies  No past medical history on file.  No past surgical history on file.  No family history on file.  Social History   Social History  . Marital Status: Married    Spouse Name: N/A  . Number of Children: N/A  . Years of Education: N/A   Occupational History  . Not on file.   Social History Main Topics  . Smoking status: Never Smoker   . Smokeless tobacco: Not on file  . Alcohol Use: No  . Drug Use: No  . Sexual Activity: Not on file   Other Topics Concern  . Not on file   Social History Narrative   Review of Systems No rash No vomiting or diarrhea Appetite is okay    Objective:   Physical Exam  Constitutional: She appears well-developed and well-nourished. No distress.  Voice normal now  HENT:  Mouth/Throat: Oropharynx is clear and moist. No oropharyngeal exudate.  No sinus tenderness TMs normal Moderate nasal inflammation  Neck: Normal range of motion. Neck supple.  Mild diffuse thyroid enlargement  Pulmonary/Chest: Effort normal and breath sounds normal. No respiratory distress. She has no wheezes. She has no rales.  Lymphadenopathy:    She has no cervical  adenopathy.  Skin: No rash noted.          Assessment & Plan:

## 2015-02-17 ENCOUNTER — Encounter: Payer: Self-pay | Admitting: Family Medicine

## 2015-06-08 ENCOUNTER — Ambulatory Visit (INDEPENDENT_AMBULATORY_CARE_PROVIDER_SITE_OTHER): Payer: BLUE CROSS/BLUE SHIELD | Admitting: Family Medicine

## 2015-06-08 ENCOUNTER — Encounter: Payer: Self-pay | Admitting: Family Medicine

## 2015-06-08 VITALS — BP 122/76 | HR 56 | Temp 98.0°F | Wt 134.3 lb

## 2015-06-08 DIAGNOSIS — R0683 Snoring: Secondary | ICD-10-CM | POA: Diagnosis not present

## 2015-06-08 DIAGNOSIS — R5383 Other fatigue: Secondary | ICD-10-CM | POA: Diagnosis not present

## 2015-06-08 LAB — CBC WITH DIFFERENTIAL/PLATELET
Basophils Absolute: 0 10*3/uL (ref 0.0–0.1)
Basophils Relative: 0.9 % (ref 0.0–3.0)
Eosinophils Absolute: 0.1 10*3/uL (ref 0.0–0.7)
Eosinophils Relative: 2.3 % (ref 0.0–5.0)
HCT: 40.5 % (ref 36.0–46.0)
Hemoglobin: 13.8 g/dL (ref 12.0–15.0)
Lymphocytes Relative: 48.4 % — ABNORMAL HIGH (ref 12.0–46.0)
Lymphs Abs: 2.1 10*3/uL (ref 0.7–4.0)
MCHC: 34 g/dL (ref 30.0–36.0)
MCV: 90.4 fl (ref 78.0–100.0)
Monocytes Absolute: 0.4 10*3/uL (ref 0.1–1.0)
Monocytes Relative: 9.5 % (ref 3.0–12.0)
Neutro Abs: 1.7 10*3/uL (ref 1.4–7.7)
Neutrophils Relative %: 38.9 % — ABNORMAL LOW (ref 43.0–77.0)
Platelets: 388 10*3/uL (ref 150.0–400.0)
RBC: 4.48 Mil/uL (ref 3.87–5.11)
RDW: 13.2 % (ref 11.5–15.5)
WBC: 4.3 10*3/uL (ref 4.0–10.5)

## 2015-06-08 LAB — COMPREHENSIVE METABOLIC PANEL
ALT: 12 U/L (ref 0–35)
AST: 17 U/L (ref 0–37)
Albumin: 4.5 g/dL (ref 3.5–5.2)
Alkaline Phosphatase: 54 U/L (ref 39–117)
BUN: 16 mg/dL (ref 6–23)
CO2: 32 mEq/L (ref 19–32)
Calcium: 10 mg/dL (ref 8.4–10.5)
Chloride: 104 mEq/L (ref 96–112)
Creatinine, Ser: 0.64 mg/dL (ref 0.40–1.20)
GFR: 106.5 mL/min (ref >60.00)
Glucose, Bld: 88 mg/dL (ref 70–99)
Potassium: 3.7 mEq/L (ref 3.5–5.1)
Sodium: 141 mEq/L (ref 135–145)
Total Bilirubin: 0.3 mg/dL (ref 0.2–1.2)
Total Protein: 8 g/dL (ref 6.0–8.3)

## 2015-06-08 LAB — LIPID PANEL
Cholesterol: 139 mg/dL (ref 0–200)
HDL: 46.7 mg/dL (ref >39.00)
LDL Cholesterol: 68 mg/dL (ref 0–99)
NonHDL: 92
Total CHOL/HDL Ratio: 3
Triglycerides: 119 mg/dL (ref 0.0–149.0)
VLDL: 23.8 mg/dL (ref 0.0–40.0)

## 2015-06-08 LAB — TSH: TSH: 0.9 u[IU]/mL (ref 0.35–4.50)

## 2015-06-08 LAB — VITAMIN B12: Vitamin B-12: 354 pg/mL (ref 211–911)

## 2015-06-08 LAB — VITAMIN D 25 HYDROXY (VIT D DEFICIENCY, FRACTURES): VITD: 20.33 ng/mL — ABNORMAL LOW (ref 30.00–100.00)

## 2015-06-08 NOTE — Progress Notes (Signed)
Pre visit review using our clinic review tool, if applicable. No additional management support is needed unless otherwise documented below in the visit note. 

## 2015-06-08 NOTE — Progress Notes (Signed)
Subjective:   Patient ID: Kristin Hunter, female    DOB: 02/08/1971, 45 y.o.   MRN: [id number]  Kristin Hunter is a pleasant 45 y.o. year old female who presents to clinic today with Fatigue  on 06/08/2015  HPI:  Several months of progressive fatigue.  Remote h/o hysterectomy.  Her husband has noticed that she is snoring more often.  He denies that it sounds like she has episodes of apnea.  She does have a deviated septum which she has not had repaired yet.  Having more headaches and daytime somnolence.  Denies feeling depressed or anxious.  H/o anemia as a child.  Has had some hot flashes- black cohosh has helped.  Current Outpatient Prescriptions on File Prior to Visit  Medication Sig Dispense Refill  . fluticasone (FLONASE) 50 MCG/ACT nasal spray Place 2 sprays into both nostrils daily.     No current facility-administered medications on file prior to visit.    No Known Allergies  No past medical history on file.  No past surgical history on file.  No family history on file.  Social History   Social History  . Marital Status: Married    Spouse Name: N/A  . Number of Children: N/A  . Years of Education: N/A   Occupational History  . Not on file.   Social History Main Topics  . Smoking status: Never Smoker   . Smokeless tobacco: Not on file  . Alcohol Use: No  . Drug Use: No  . Sexual Activity: Not on file   Other Topics Concern  . Not on file   Social History Narrative   The PMH, PSH, Social History, Family History, Medications, and allergies [facility], and have been updated if relevant.   Review of Systems  Constitutional: Positive for fatigue. Negative for unexpected weight change.  HENT:       +snoring  Eyes: Negative.   Respiratory: Negative.   Cardiovascular: Negative.   Gastrointestinal: Negative.   Endocrine: Negative.   Musculoskeletal: Negative.   Skin: Negative.   Allergic/Immunologic: Negative.   Neurological:  Positive for headaches. Negative for tremors, seizures, speech difficulty, weakness, light-headedness and numbness.  Hematological: Negative.   Psychiatric/Behavioral: Negative.   All other systems reviewed and are negative.      Objective:    BP 122/76 mmHg  Pulse 56  Temp(Src) 98 F (36.7 C) (Oral)  Wt 134 lb 5 oz (60.924 kg)  SpO2 98%  LMP 11/27/2010  Wt Readings from Last 3 Encounters:  06/08/15 134 lb 5 oz (60.924 kg)  01/21/15 132 lb (59.875 kg)  11/25/13 127 lb 4 oz (57.72 kg)    Physical Exam  Constitutional: She is oriented to person, place, and time. She appears well-developed and well-nourished. No distress.  HENT:  Head: Normocephalic and atraumatic.  Eyes: Conjunctivae are normal.  Neck: Normal range of motion. Neck supple. No thyromegaly present.  Cardiovascular: Normal rate, regular rhythm and normal heart sounds.   Pulmonary/Chest: Effort normal and breath sounds normal. No respiratory distress. She has no wheezes. She has no rales. She exhibits no tenderness.  Musculoskeletal: Normal range of motion. She exhibits no edema.  Lymphadenopathy:    She has no cervical adenopathy.  Neurological: She is alert and oriented to person, place, and time. No cranial nerve deficit. Coordination normal.  Skin: Skin is warm and dry. She is not diaphoretic.  Psychiatric: She has a normal mood and affect. Her behavior is normal. Judgment and thought  content normal.  Nursing note and vitals reviewed.         Assessment & Plan:   Other fatigue - Plan: Vitamin B12, CBC with Differential/Platelet, Comprehensive metabolic panel, TSH, Lipid panel, Vitamin D, 25-hydroxy  Snoring - Plan: Ambulatory referral to Pulmonology No Follow-up on file.

## 2015-06-08 NOTE — Patient Instructions (Signed)
  Great to see you. I will call you with your lab results.  We will call you about a sleep study as well.

## 2015-06-08 NOTE — Assessment & Plan Note (Signed)
New- progressive. >25 minutes spent in face to face time with patient, >50% spent in counselling or coordination of care concerning fatigue and snoring. Start with lab work up and referral for sleep study. The patient indicates understanding of these issues and agrees with the plan. Orders Placed This Encounter  Procedures  . Vitamin B12  . CBC with Differential/Platelet  . Comprehensive metabolic panel  . TSH  . Lipid panel  . Vitamin D, 25-hydroxy  . Ambulatory referral to Pulmonology

## 2015-06-09 MED ORDER — VITAMIN D (ERGOCALCIFEROL) 1.25 MG (50000 UNIT) PO CAPS: 50000.0000 [IU] | ORAL_CAPSULE | ORAL | Status: DC

## 2015-06-09 NOTE — Addendum Note (Signed)
Addended by: Desmond DikeKNIGHT, Duron Meister H on: 06/09/2015 10:08 AM   Modules accepted: Orders

## 2015-06-21 DIAGNOSIS — Z1231 Encounter for screening mammogram for malignant neoplasm of breast: Secondary | ICD-10-CM | POA: Diagnosis not present

## 2015-06-21 DIAGNOSIS — R55 Syncope and collapse: Secondary | ICD-10-CM | POA: Diagnosis not present

## 2015-06-21 DIAGNOSIS — Z01419 Encounter for gynecological examination (general) (routine) without abnormal findings: Secondary | ICD-10-CM | POA: Diagnosis not present

## 2015-06-23 ENCOUNTER — Ambulatory Visit (INDEPENDENT_AMBULATORY_CARE_PROVIDER_SITE_OTHER): Payer: BLUE CROSS/BLUE SHIELD | Admitting: Internal Medicine

## 2015-06-23 ENCOUNTER — Encounter: Payer: Self-pay | Admitting: Internal Medicine

## 2015-06-23 VITALS — BP 122/76 | HR 96 | Ht 63.0 in | Wt 135.0 lb

## 2015-06-23 DIAGNOSIS — G4719 Other hypersomnia: Secondary | ICD-10-CM

## 2015-06-23 NOTE — Progress Notes (Signed)
[facility] Consultation      Assessment and Plan:  Excessive daytime sleepiness. -Daytime sleepiness, with snoring, morning headaches, suggestive of obstructive sleep apnea.  Bruxism. -Currently has some teeth grinding at night, she is already wearing a dental device.  Headache. -Possibly secondary to sleep apnea.  Date: 06/23/2015  MRN# [id number] Kristin Hunter 46-12-1970  Referring Physician: Dr. Dayton Martes.   Kristin Hunter is a 45 y.o. old female seen in consultation for chief complaint of:    Chief Complaint  Patient presents with  . sleep consult    ref by dr Dayton Martes for snoring. pt c/o loud snoring, daytime sleepiness, restless sleep. EPWORTH:12    HPI:   Reviews of most recent lab testing including metabolic profile, CBC, TSH performed on 06/08/15: Summary: appears normal without etiology to explain the patient's excessive fatigue.  She went to her PCP for complaints of fatifue, she has been feeling tired all of the time. She notes that her husband tells her that she snores every night. She also grinds her teeth, her husband has given her a nudge occasionally to wake up.  She was seen by ENT a few years ago and was noted to sinus disease with deviated nasal septum. She has been on flonase which appears to control the sinus drainage.   She goes to bed at 11pm, watches tv, falls asleep around [place]. Wakes at 630, when wakes in am does not feel refreshed. Often wakes with a headache.  On weekends she sleeps until 830, she feels no better on those days. She takes a nap for one hour occasionally on weekends.    PMHX:   No past medical history on file. Surgical Hx:  No past surgical history on file. Family Hx:  No family history on file. Social Hx:   Social History  Substance Use Topics  . Smoking status: Never Smoker   . Smokeless tobacco: Not on file  . Alcohol Use: No   Medication:   Current Outpatient Rx  Name  Route  Sig  Dispense  Refill  .  fluticasone (FLONASE) 50 MCG/ACT nasal spray   Each Nare   Place 2 sprays into both nostrils daily.         . Vitamin D, Ergocalciferol, (DRISDOL) 50000 units CAPS capsule   Oral   Take 1 capsule (50,000 Units total) by mouth every 7 (seven) days.   6 capsule   0       Allergies:  Review of patient's allergies indicates no known allergies.  Review of Systems: Gen:  Denies  fever, sweats, chills HEENT: Denies blurred vision, double vision.  Cvc:  No dizziness, chest pain. Resp:   Denies cough or sputum production,  Gi: Denies swallowing difficulty, stomach pain. Gu:  Denies bladder incontinence, burning urine Ext:   No Joint pain, stiffness. Skin: No skin rash,  hives  Endoc:  No polyuria, polydipsia. Psych: No depression, insomnia. Other:  All other systems were reviewed with the patient and were negative other that what is mentioned in the HPI.   Physical Examination:   VS: BP 122/76 mmHg  Pulse 96  Ht  (1.6 m)  Wt 135 lb (61.236 kg)  BMI 23.92 kg/m2  SpO2 99%  LMP 11/27/2010  General Appearance: No distress  Neuro:without focal findings,  speech normal,  HEENT: PERRLA, EOM intact.  [place] 3.  Pulmonary: normal breath sounds, CardiovascularNormal S1,S2.  No m/r/g.   Abdomen: Benign, Soft, non-tender. Renal:  No costovertebral tenderness  GU:  No performed at this time. Endoc: No evident thyromegaly, no signs of acromegaly. Skin:   warm, no rashes, no ecchymosis  Extremities: normal, no cyanosis, clubbing.  Other findings:    LABORATORY PANEL:   CBC No results for input(s): WBC, HGB, HCT, PLT in the last 168 hours. ------------------------------------------------------------------------------------------------------------------  Chemistries  No results for input(s): NA, K, CL, CO2, GLUCOSE, BUN, CREATININE, CALCIUM, MG, AST, ALT, ALKPHOS, BILITOT in the last 168 hours.  Invalid input(s):  GFRCGP ------------------------------------------------------------------------------------------------------------------  Cardiac Enzymes No results for input(s): TROPONINI in the last 168 hours. ------------------------------------------------------------  RADIOLOGY:  No results found.     Thank  you for the consultation and for allowing [facility] to assist in the care of your patient. Our recommendations are noted above.  Please contact us if we can be of further service.   Wells Guileseep Gabriellia Rempel, MD.  Board Certified in Internal Medicine, Pulmonary Medicine, Critical Care Medicine, and Sleep Medicine.  [facility] Office Number: [phone]  Santiago Gladavid Kasa, M.D.  Stephanie AcreVishal Mungal, M.D.  Billy Fischeravid Simonds, M.D  06/23/2015

## 2015-06-23 NOTE — Patient Instructions (Signed)
Will send for sleep study.    Sleep Apnea Sleep apnea is disorder that affects a person's sleep. A person with sleep apnea has abnormal pauses in their breathing when they sleep. It is hard for them to get a good sleep. This makes a person tired during the day. It also can lead to other physical problems. There are three types of sleep apnea. One type is when breathing stops for a short time because your airway is blocked (obstructive sleep apnea). Another type is when the brain sometimes fails to give the normal signal to breathe to the muscles that control your breathing (central sleep apnea). The third type is a combination of the other two types. HOME CARE   Take all medicine as told by your doctor.  Avoid alcohol, calming medicines (sedatives), and depressant drugs.  Try to lose weight if you are overweight. Talk to your doctor about a healthy weight goal.  Your doctor may have you use a device that helps to open your airway. It can help you get the air that you need. It is called a positive airway pressure (PAP) device.   MAKE SURE YOU:   Understand these instructions.  Will watch your condition.  Will get help right away if you are not doing well or get worse.  It may take approximately 1 month for you to get used to wearing her CPAP every night.   

## 2015-07-13 DIAGNOSIS — R55 Syncope and collapse: Secondary | ICD-10-CM | POA: Diagnosis not present

## 2015-07-22 ENCOUNTER — Inpatient Hospital Stay
Admission: RE | Admit: 2015-07-22 | Discharge: 2015-07-22 | Disposition: A | Discharge status: Home / Self Care | Payer: Self-pay | Source: Ambulatory Visit | Attending: *Deleted | Admitting: *Deleted

## 2015-07-22 ENCOUNTER — Other Ambulatory Visit: Payer: Self-pay | Admitting: *Deleted

## 2015-07-22 ENCOUNTER — Other Ambulatory Visit: Payer: Self-pay | Admitting: Obstetrics and Gynecology

## 2015-07-22 DIAGNOSIS — Z9289 Personal history of other medical treatment: Secondary | ICD-10-CM

## 2015-07-22 DIAGNOSIS — Z1231 Encounter for screening mammogram for malignant neoplasm of breast: Secondary | ICD-10-CM

## 2015-08-02 ENCOUNTER — Ambulatory Visit: Payer: BLUE CROSS/BLUE SHIELD | Attending: Pulmonary Disease

## 2015-08-02 DIAGNOSIS — G4710 Hypersomnia, unspecified: Secondary | ICD-10-CM | POA: Diagnosis present

## 2015-08-02 DIAGNOSIS — R0683 Snoring: Secondary | ICD-10-CM | POA: Diagnosis not present

## 2015-08-04 DIAGNOSIS — R0683 Snoring: Secondary | ICD-10-CM | POA: Diagnosis not present

## 2015-08-18 ENCOUNTER — Telehealth: Payer: Self-pay

## 2015-08-18 ENCOUNTER — Other Ambulatory Visit: Payer: Self-pay

## 2015-08-18 NOTE — Telephone Encounter (Signed)
LMOVM in regards to sleep study results. Will await call back.  

## 2015-08-18 NOTE — Telephone Encounter (Signed)
Pt aware of negative sleep study results Pt provided with two web sites to help with daytime sleepiness Pt voiced understanding Nothing further needed

## 2015-09-05 ENCOUNTER — Telehealth: Payer: Self-pay | Admitting: Family Medicine

## 2015-09-05 NOTE — Telephone Encounter (Signed)
Patient Name: Kristin BuntingLENOR Hunter DOB: 09/11/1970 Initial Comment Caller states she has been experiencing a numbness in her lip. Sometimes it's the bottom, sometimes the top. It lasts for an hour or so and goes away. There's no real frequency to it, it happens randomly. Nurse Assessment Nurse: Charna Elizabethrumbull, RN, Cathy Date/Time (Eastern Time): 09/05/2015 12:28:18 PM Confirm and document reason for call. If symptomatic, describe symptoms. You must click the next button to save text entered. ---[name] states she has been having trouble with numbness on and off of her lips for the past 3-4 months that became worse again last weekend. No new injury in the past 3 days. No severe breathing difficulty. Alert and responsive. Has the patient traveled out of the country within the last 30 days? ---No Does the patient have any new or worsening symptoms? ---Yes Will a triage be completed? ---Yes Related visit to physician within the last 2 weeks? ---No Does the PT have any chronic conditions? (i.e. diabetes, asthma, etc.) ---Yes List chronic conditions. ---Migraines, Low iron levels, Anemia Is the patient pregnant or possibly pregnant? (Ask all females between the ages of 6612-55) ---No Is this a behavioral health or substance abuse call? ---No Guidelines Guideline Title Affirmed Question Affirmed Notes Neurologic Deficit [1] Numbness or tingling on both sides of body AND [2] is a new symptom present < 24 hours Final Disposition User [facility] Gibsonburgrumbull, RN, Lynden Angathy Comments Scheduled appointment with Dr. Dayton MartesAron at 12 noon tomorrow. Upgraded to See Within 24 Hours due to caller's concerns. Referrals REFERRED TO PCP OFFICE Disagree/Comply: Comply

## 2015-09-06 ENCOUNTER — Encounter: Payer: Self-pay | Admitting: Family Medicine

## 2015-09-06 ENCOUNTER — Ambulatory Visit (INDEPENDENT_AMBULATORY_CARE_PROVIDER_SITE_OTHER): Payer: BLUE CROSS/BLUE SHIELD | Admitting: Family Medicine

## 2015-09-06 VITALS — BP 118/84 | HR 84 | Temp 98.1°F | Wt 135.5 lb

## 2015-09-06 DIAGNOSIS — R200 Anesthesia of skin: Secondary | ICD-10-CM | POA: Insufficient documentation

## 2015-09-06 DIAGNOSIS — R208 Other disturbances of skin sensation: Secondary | ICD-10-CM | POA: Diagnosis not present

## 2015-09-06 LAB — CBC WITH DIFFERENTIAL/PLATELET
Basophils Absolute: 0.1 10*3/uL (ref 0.0–0.1)
Basophils Relative: 1.3 % (ref 0.0–3.0)
Eosinophils Absolute: 0.1 10*3/uL (ref 0.0–0.7)
Eosinophils Relative: 1.3 % (ref 0.0–5.0)
HCT: 41.3 % (ref 36.0–46.0)
Hemoglobin: 13.7 g/dL (ref 12.0–15.0)
Lymphocytes Relative: 44 % (ref 12.0–46.0)
Lymphs Abs: 1.9 10*3/uL (ref 0.7–4.0)
MCHC: 33.2 g/dL (ref 30.0–36.0)
MCV: 92.1 fl (ref 78.0–100.0)
Monocytes Absolute: 0.4 10*3/uL (ref 0.1–1.0)
Monocytes Relative: 8.3 % (ref 3.0–12.0)
Neutro Abs: 1.9 10*3/uL (ref 1.4–7.7)
Neutrophils Relative %: 45.1 % (ref 43.0–77.0)
Platelets: 409 10*3/uL — ABNORMAL HIGH (ref 150.0–400.0)
RBC: 4.49 Mil/uL (ref 3.87–5.11)
RDW: 13.7 % (ref 11.5–15.5)
WBC: 4.2 10*3/uL (ref 4.0–10.5)

## 2015-09-06 LAB — COMPREHENSIVE METABOLIC PANEL
ALT: 9 U/L (ref 0–35)
AST: 12 U/L (ref 0–37)
Albumin: 4.4 g/dL (ref 3.5–5.2)
Alkaline Phosphatase: 56 U/L (ref 39–117)
BUN: 13 mg/dL (ref 6–23)
CO2: 31 mEq/L (ref 19–32)
Calcium: 9.4 mg/dL (ref 8.4–10.5)
Chloride: 104 mEq/L (ref 96–112)
Creatinine, Ser: 0.65 mg/dL (ref 0.40–1.20)
GFR: 104.5 mL/min (ref >60.00)
Glucose, Bld: 89 mg/dL (ref 70–99)
Potassium: 3.7 mEq/L (ref 3.5–5.1)
Sodium: 138 mEq/L (ref 135–145)
Total Bilirubin: 0.3 mg/dL (ref 0.2–1.2)
Total Protein: 8 g/dL (ref 6.0–8.3)

## 2015-09-06 LAB — VITAMIN D 25 HYDROXY (VIT D DEFICIENCY, FRACTURES): VITD: 36.33 ng/mL (ref 30.00–100.00)

## 2015-09-06 LAB — VITAMIN B12: Vitamin B-12: 306 pg/mL (ref 211–911)

## 2015-09-06 NOTE — Patient Instructions (Signed)
Good to see you.  Keep a journal.  Take a bendryl or zyrtec the minute it happens.  I will call you with your lab results.

## 2015-09-06 NOTE — Assessment & Plan Note (Signed)
New- intermittent with no other focal neurological deficits or neurological findings on exam. Will start with lab work today. Advised to keep a symptoms journal to see if certain foods (like shellfish) are a trigger.  Try benadryl or zyrtec when it occurs.  May need to refer to neuro. The patient indicates understanding of these issues and agrees with the plan. Orders Placed This Encounter  Procedures  . Vitamin B12  . Vitamin D, 25-hydroxy  . CBC with Differential/Platelet  . Comprehensive metabolic panel  . Sjogren's syndrome antibods(ssa + ssb)

## 2015-09-06 NOTE — Progress Notes (Signed)
Subjective:   Patient ID: Kristin Hunter, female    DOB: 10/26/1970, 45 y.o.   MRN: [id number]  Kristin Hunter is a pleasant 45 y.o. year old female who presents to clinic today with Numbness  on 09/06/2015  HPI:  Lip numbness-  Intermittent episodes of lip numbness- sometimes involving lower lip, sometimes upper lip over past 304 months.  Usually lasts an hour or so.  Nothing seems to make it better or worse. She cannot identify any triggers. No feeling like tongue is swelling or like throat is closing shut. No difficulty breathing.  NKDA or food allergies.    Lab Results  Component Value Date   VITAMINB12 354 06/08/2015   Lab Results  Component Value Date   WBC 4.3 06/08/2015   HGB 13.8 06/08/2015   HCT 40.5 06/08/2015   MCV 90.4 06/08/2015   PLT 388.0 06/08/2015   Lab Results  Component Value Date   NA 141 06/08/2015   K 3.7 06/08/2015   CL 104 06/08/2015   CO2 32 06/08/2015   Lab Results  Component Value Date   CALCIUM 10.0 06/08/2015   Vit D20 in 05/2015  Current Outpatient Prescriptions on File Prior to Visit  Medication Sig Dispense Refill  . fluticasone (FLONASE) 50 MCG/ACT nasal spray Place 2 sprays into both nostrils daily.    . Multiple Vitamin (MULTI-VITAMINS) TABS Take by mouth.    Marland Kitchen. OVER THE COUNTER MEDICATION Take by mouth daily. CA PH TRI/E AC SUCC/HERB23 (HOT FLASHEX ORAL)    . OVER THE COUNTER MEDICATION daily. BIOTIN ORAL     No current facility-administered medications on file prior to visit.    No Known Allergies  No past medical history on file.  No past surgical history on file.  No family history on file.  Social History   Social History  . Marital Status: Married    Spouse Name: N/A  . Number of Children: N/A  . Years of Education: N/A   Occupational History  . Not on file.   Social History Main Topics  . Smoking status: Never Smoker   . Smokeless tobacco: Not on file  . Alcohol Use: No  . Drug Use: No  . Sexual  Activity: Not on file   Other Topics Concern  . Not on file   Social History Narrative   The PMH, PSH, Social History, Family History, Medications, and allergies have been [facility], and have been updated if relevant.  Review of Systems  HENT: Negative for ear pain, facial swelling, sore throat, trouble swallowing and voice change.        +lip numbness  Eyes: Negative.   Respiratory: Negative.   Cardiovascular: Negative.   Gastrointestinal: Negative.   Endocrine: Negative.   Genitourinary: Negative.   Musculoskeletal: Negative.   Skin: Negative.   Neurological: Negative.   All other systems reviewed and are negative.      Objective:    BP 118/84 mmHg  Pulse 84  Temp(Src) 98.1 F (36.7 C) (Oral)  Wt 135 lb 8 oz (61.462 kg)  SpO2 99%  LMP 11/27/2010   Physical Exam  Constitutional: She is oriented to person, place, and time. She appears well-developed and well-nourished. No distress.  HENT:  Head: Normocephalic.  Eyes: Conjunctivae are normal.  Neck: Normal range of motion. Neck supple. No tracheal deviation present. No thyromegaly present.  Cardiovascular: Normal rate and regular rhythm.   Pulmonary/Chest: Effort normal and breath sounds normal.  Musculoskeletal: Normal range of  motion.  Neurological: She is alert and oriented to person, place, and time. No cranial nerve deficit.  Skin: Skin is warm and dry. She is not diaphoretic.  Psychiatric: She has a normal mood and affect. Her behavior is normal. Judgment and thought content normal.  Nursing note and vitals reviewed.         Assessment & Plan:   Lip numbness No Follow-up on file.

## 2015-09-07 ENCOUNTER — Ambulatory Visit: Payer: BLUE CROSS/BLUE SHIELD | Admitting: Family Medicine

## 2015-09-07 LAB — SJOGREN'S SYNDROME ANTIBODS(SSA + SSB)
SSA (Ro) (ENA) Antibody, IgG: <1
SSB (La) (ENA) Antibody, IgG: <1

## 2015-09-08 ENCOUNTER — Other Ambulatory Visit: Payer: Self-pay | Admitting: Family Medicine

## 2015-09-08 DIAGNOSIS — R200 Anesthesia of skin: Secondary | ICD-10-CM

## 2015-10-04 ENCOUNTER — Encounter: Payer: Self-pay | Admitting: Family Medicine

## 2015-12-15 DIAGNOSIS — H04123 Dry eye syndrome of bilateral lacrimal glands: Secondary | ICD-10-CM | POA: Diagnosis not present

## 2015-12-19 ENCOUNTER — Ambulatory Visit: Payer: BLUE CROSS/BLUE SHIELD | Admitting: Neurology

## 2016-02-16 ENCOUNTER — Ambulatory Visit: Payer: BLUE CROSS/BLUE SHIELD

## 2016-03-08 ENCOUNTER — Telehealth: Payer: Self-pay | Admitting: Internal Medicine

## 2016-03-08 NOTE — Telephone Encounter (Signed)
Called and spoke with pt and she stated that she does not want to have surgery---she is not going to have the surgery.  She has tried the nasal strips---she is tired all of the time and is wanting to see if anything can be recs by MR.  Pt is aware that MR is out until after the first of the year.  MR please advise. Thanks  No Known Allergies

## 2016-03-14 NOTE — Telephone Encounter (Signed)
Message will be routed to Dr. Nicholos Johnsamachandran.

## 2016-03-14 NOTE — Telephone Encounter (Signed)
I think she contacted the wrong office. I did not refer her for a surgery, I only sent her for a sleep study.

## 2016-03-14 NOTE — Telephone Encounter (Signed)
I have never seen this patient. Please forward to Dr Wells Guileseep Ramachandran  Thanks  Dr. Kalman ShanMurali Bunyan Brier, M.D., [facility] Pulmonary and Critical Care Medicine Staff Physician [facility] Pager: [phone], If no answer or between  15:00h - 7:00h: call [phone]  03/14/2016 8:49 AM

## 2016-03-15 NOTE — Telephone Encounter (Signed)
LMOM for pt to return call. 

## 2016-03-20 ENCOUNTER — Ambulatory Visit: Payer: BLUE CROSS/BLUE SHIELD | Attending: Obstetrics and Gynecology

## 2016-03-20 NOTE — Telephone Encounter (Signed)
LMOM for pt to return call. 

## 2016-03-20 NOTE — Telephone Encounter (Signed)
Recommend she try a commercially available snoring devices such as Zyppah or PureSleep. Also if her symptoms progress or remain over the next 1-2 years we could retest her as her snoring may progress into sleep apnea in the future.

## 2016-03-20 NOTE — Telephone Encounter (Signed)
Spoke with pt and she states that she seen ENT for a deviated septum and she has refused to have surgery to have it fixed. States her sleep study was negative and she wants to know if there is any other options. Please advise.

## 2016-03-20 NOTE — Telephone Encounter (Signed)
LMOM for pt to return call for response.

## 2016-03-22 NOTE — Telephone Encounter (Signed)
Pt informed of response. Nothing further needed. 

## 2016-03-22 NOTE — Telephone Encounter (Signed)
LMOM for pt to return call. 

## 2016-04-23 ENCOUNTER — Ambulatory Visit
Admission: RE | Admit: 2016-04-23 | Discharge: 2016-04-23 | Disposition: A | Discharge status: Home / Self Care | Payer: BLUE CROSS/BLUE SHIELD | Source: Ambulatory Visit | Attending: Obstetrics and Gynecology | Admitting: Obstetrics and Gynecology

## 2016-04-23 DIAGNOSIS — Z1231 Encounter for screening mammogram for malignant neoplasm of breast: Secondary | ICD-10-CM | POA: Insufficient documentation

## 2016-06-26 DIAGNOSIS — Z4802 Encounter for removal of sutures: Secondary | ICD-10-CM | POA: Diagnosis not present

## 2016-06-26 DIAGNOSIS — R42 Dizziness and giddiness: Secondary | ICD-10-CM | POA: Diagnosis not present

## 2016-06-26 DIAGNOSIS — R002 Palpitations: Secondary | ICD-10-CM | POA: Diagnosis not present

## 2016-06-26 DIAGNOSIS — R030 Elevated blood-pressure reading, without diagnosis of hypertension: Secondary | ICD-10-CM | POA: Diagnosis not present

## 2016-06-27 ENCOUNTER — Encounter: Payer: Self-pay | Admitting: Family Medicine

## 2016-06-27 ENCOUNTER — Ambulatory Visit (INDEPENDENT_AMBULATORY_CARE_PROVIDER_SITE_OTHER): Payer: BLUE CROSS/BLUE SHIELD | Admitting: Family Medicine

## 2016-06-27 ENCOUNTER — Encounter (INDEPENDENT_AMBULATORY_CARE_PROVIDER_SITE_OTHER): Payer: Self-pay

## 2016-06-27 DIAGNOSIS — I10 Essential (primary) hypertension: Secondary | ICD-10-CM | POA: Insufficient documentation

## 2016-06-27 DIAGNOSIS — R030 Elevated blood-pressure reading, without diagnosis of hypertension: Secondary | ICD-10-CM | POA: Diagnosis not present

## 2016-06-27 MED ORDER — HYDROCHLOROTHIAZIDE 12.5 MG PO CAPS: 12.5000 mg | ORAL_CAPSULE | Freq: Every day | ORAL | 3 refills | Status: DC

## 2016-06-27 NOTE — Progress Notes (Signed)
Subjective:   Patient ID: Kristin Hunter, female    DOB: 1970/04/27, 46 y.o.   MRN: [id number]  Kristin Hunter is a pleasant 46 y.o. year old female who presents to clinic today with Hypertension (Felt dizzy yesterday. Cannot remember systolic numbers but diastolic was over 100. Last night it was 171/103. [facility] yesterday. Has a headache today)  on 06/27/2016  HPI:  Woke up yesterday and felt dizzy.  Has felt dizzy on and off. Checked her BP at home.  She doesn't remember systolic but diastolic was over 100.  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  Has been eating out more.  Husband cooking more now too that she is going back to school as well.  Denies HA, blurred vision, CP or SOB.   Brings in notes from employee health which consist of EKG and CMET only.  BP Readings from Last 3 Encounters:  06/27/16 (!) 160/108  09/06/15 118/84  06/23/15 122/76   Does have a strong FH of HTN. Current Outpatient Prescriptions on File Prior to Visit  Medication Sig Dispense Refill  . estradiol (VIVELLE-DOT) 0.025 MG/24HR Place onto the skin.    . fluticasone (FLONASE) 50 MCG/ACT nasal spray Place 2 sprays into both nostrils daily.    . Multiple Vitamin (MULTI-VITAMINS) TABS Take by mouth.    Marland Kitchen OVER THE COUNTER MEDICATION daily. BIOTIN ORAL     No current facility-administered medications on file prior to visit.     No Known Allergies  No past medical history on file.  No past surgical history on file.  Family History  Problem Relation Age of Onset  . Breast cancer Maternal Aunt     late 20's    Social History   Social History  . Marital status: Married    Spouse name: N/A  . Number of children: N/A  . Years of education: N/A   Occupational History  . Not on file.   Social History Main Topics  . Smoking status: Never Smoker  . Smokeless tobacco: Never Used  . Alcohol use No  . Drug use: No  . Sexual activity: Not on file   Other Topics Concern    . Not on file   Social History Narrative  . No narrative on file   The PMH, PSH, Social History, Family History, Medications, and allergies have been reviewed in [facility], and have been updated if relevant.   Review of Systems  Constitutional: Negative.   HENT: Negative.   Respiratory: Negative.   Cardiovascular: Negative.   Neurological: Positive for dizziness. Negative for tremors, seizures, syncope, facial asymmetry, speech difficulty, weakness, light-headedness, numbness and headaches.  All other systems reviewed and are negative.      Objective:    BP (!) 160/108 (BP Location: Right Arm, Patient Position: Sitting, Cuff Size: Normal)   Pulse 92   Temp 98.2 F (36.8 C) (Oral)   Wt 131 lb (59.4 kg)   LMP 11/27/2010   SpO2 98%   BMI 23.21 kg/m    Physical Exam  Constitutional: She is oriented to person, place, and time. She appears well-developed and well-nourished. No distress.  HENT:  Head: Normocephalic and atraumatic.  Eyes: Conjunctivae are normal.  Cardiovascular: Normal rate and regular rhythm.   Pulmonary/Chest: Effort normal and breath sounds normal.  Musculoskeletal: Normal range of motion. She exhibits no edema.  Neurological: She is alert and oriented to person, place, and time. No cranial nerve deficit.  Skin: Skin  is warm and dry. She is not diaphoretic.  Psychiatric: She has a normal mood and affect. Her behavior is normal. Judgment and thought content normal.  Nursing note and vitals reviewed.         Assessment & Plan:   Elevated blood pressure reading No Follow-up on file.

## 2016-06-27 NOTE — Assessment & Plan Note (Signed)
New- with multiple elevated readings, increased salt intake and strong family history. Start low dose HCTZ, DASH diet. Follow up in 2 weeks.

## 2016-06-27 NOTE — Patient Instructions (Addendum)
DASH Eating Plan DASH stands for "Dietary Approaches to Stop Hypertension." The DASH eating plan is a healthy eating plan that has been shown to reduce high blood pressure (hypertension). It may also reduce your risk for type 2 diabetes, heart disease, and stroke. The DASH eating plan may also help with weight loss. What are tips for following this plan? General guidelines   Avoid eating more than 2,300 mg (milligrams) of salt (sodium) a day. If you have hypertension, you may need to reduce your sodium intake to 1,500 mg a day.  Limit alcohol intake to no more than 1 drink a day for nonpregnant women and 2 drinks a day for men. One drink equals 12 oz of beer, 5 oz of wine, or 1 oz of hard liquor.  Work with your health care provider to maintain a healthy body weight or to lose weight. Ask what an ideal weight is for you.  Get at least 30 minutes of exercise that causes your heart to beat faster (aerobic exercise) most days of the week. Activities may include walking, swimming, or biking.  Work with your health care provider or diet and nutrition specialist (dietitian) to adjust your eating plan to your individual calorie needs. Reading food labels   Check food labels for the amount of sodium per serving. Choose foods with less than 5 percent of the Daily Value of sodium. Generally, foods with less than 300 mg of sodium per serving fit into this eating plan.  To find whole grains, look for the word "whole" as the first word in the ingredient list. Shopping   Buy products labeled as "low-sodium" or "no salt added."  Buy fresh foods. Avoid canned foods and premade or frozen meals. Cooking   Avoid adding salt when cooking. Use salt-free seasonings or herbs instead of table salt or sea salt. Check with your health care provider or pharmacist before using salt substitutes.  Do not fry foods. Cook foods using healthy methods such as baking, boiling, grilling, and broiling instead.  Cook with  heart-healthy oils, such as olive, canola, soybean, or sunflower oil. Meal planning    Eat a balanced diet that includes:  5 or more servings of fruits and vegetables each day. At each meal, try to fill half of your plate with fruits and vegetables.  Up to 6-8 servings of whole grains each day.  Less than 6 oz of lean meat, poultry, or fish each day. A 3-oz serving of meat is about the same size as a deck of cards. One egg equals 1 oz.  2 servings of low-fat dairy each day.  A serving of nuts, seeds, or beans 5 times each week.  Heart-healthy fats. Healthy fats called Omega-3 fatty acids are found in foods such as flaxseeds and coldwater fish, like sardines, salmon, and mackerel.  Limit how much you eat of the following:  Canned or prepackaged foods.  Food that is high in trans fat, such as fried foods.  Food that is high in saturated fat, such as fatty meat.  Sweets, desserts, sugary drinks, and other foods with added sugar.  Full-fat dairy products.  Do not salt foods before eating.  Try to eat at least 2 vegetarian meals each week.  Eat more home-cooked food and less restaurant, buffet, and fast food.  When eating at a restaurant, ask that your food be prepared with less salt or no salt, if possible. What foods are recommended? The items listed may not be a complete list. Talk   with your dietitian about what dietary choices are best for you. Grains  Whole-grain or whole-wheat bread. Whole-grain or whole-wheat pasta. Brown rice. Oatmeal. Quinoa. Bulgur. Whole-grain and low-sodium cereals. Pita bread. Low-fat, low-sodium crackers. Whole-wheat flour tortillas. Vegetables  Fresh or frozen vegetables (raw, steamed, roasted, or grilled). Low-sodium or reduced-sodium tomato and vegetable juice. Low-sodium or reduced-sodium tomato sauce and tomato paste. Low-sodium or reduced-sodium canned vegetables. Fruits  All fresh, dried, or frozen fruit. Canned fruit in natural juice  (without added sugar). Meat and other protein foods  Skinless chicken or turkey. Ground chicken or turkey. Pork with fat trimmed off. Fish and seafood. Egg whites. Dried beans, peas, or lentils. Unsalted nuts, nut butters, and seeds. Unsalted canned beans. Lean cuts of beef with fat trimmed off. Low-sodium, lean deli meat. Dairy  Low-fat (1%) or fat-free (skim) milk. Fat-free, low-fat, or reduced-fat cheeses. Nonfat, low-sodium ricotta or cottage cheese. Low-fat or nonfat yogurt. Low-fat, low-sodium cheese. Fats and oils  Soft margarine without trans fats. Vegetable oil. Low-fat, reduced-fat, or light mayonnaise and salad dressings (reduced-sodium). Canola, safflower, olive, soybean, and sunflower oils. Avocado. Seasoning and other foods  Herbs. Spices. Seasoning mixes without salt. Unsalted popcorn and pretzels. Fat-free sweets. What foods are not recommended? The items listed may not be a complete list. Talk with your dietitian about what dietary choices are best for you. Grains  Baked goods made with fat, such as croissants, muffins, or some breads. Dry pasta or rice meal packs. Vegetables  Creamed or fried vegetables. Vegetables in a cheese sauce. Regular canned vegetables (not low-sodium or reduced-sodium). Regular canned tomato sauce and paste (not low-sodium or reduced-sodium). Regular tomato and vegetable juice (not low-sodium or reduced-sodium). Pickles. Olives. Fruits  Canned fruit in a light or heavy syrup. Fried fruit. Fruit in cream or butter sauce. Meat and other protein foods  Fatty cuts of meat. Ribs. Fried meat. Bacon. Sausage. Bologna and other processed lunch meats. Salami. Fatback. Hotdogs. Bratwurst. Salted nuts and seeds. Canned beans with added salt. Canned or smoked fish. Whole eggs or egg yolks. Chicken or turkey with skin. Dairy  Whole or 2% milk, cream, and half-and-half. Whole or full-fat cream cheese. Whole-fat or sweetened yogurt. Full-fat cheese. Nondairy creamers.  Whipped toppings. Processed cheese and cheese spreads. Fats and oils  Butter. Stick margarine. Lard. Shortening. Ghee. Bacon fat. Tropical oils, such as coconut, palm kernel, or palm oil. Seasoning and other foods  Salted popcorn and pretzels. Onion salt, garlic salt, seasoned salt, table salt, and sea salt. Worcestershire sauce. Tartar sauce. Barbecue sauce. Teriyaki sauce. Soy sauce, including reduced-sodium. Steak sauce. Canned and packaged gravies. Fish sauce. Oyster sauce. Cocktail sauce. Horseradish that you find on the shelf. Ketchup. Mustard. Meat flavorings and tenderizers. Bouillon cubes. Hot sauce and Tabasco sauce. Premade or packaged marinades. Premade or packaged taco seasonings. Relishes. Regular salad dressings. Where to find more information:  National Heart, Lung, and Blood Institute: www.nhlbi.nih.gov  American Heart Association: www.heart.org Summary  The DASH eating plan is a healthy eating plan that has been shown to reduce high blood pressure (hypertension). It may also reduce your risk for type 2 diabetes, heart disease, and stroke.  With the DASH eating plan, you should limit salt (sodium) intake to 2,300 mg a day. If you have hypertension, you may need to reduce your sodium intake to 1,500 mg a day.  When on the DASH eating plan, aim to eat more fresh fruits and vegetables, whole grains, lean proteins, low-fat dairy, and heart-healthy fats.  Work   with your health care provider or diet and nutrition specialist (dietitian) to adjust your eating plan to your individual calorie needs. This information is not intended to replace advice given to you by your health care provider. Make sure you discuss any questions you have with your health care provider. Document Released: 02/15/2011 Document Revised: 02/20/2016 Document Reviewed: 02/20/2016 Elsevier Interactive Patient Education  2017 [employer].   Please come see me in 2 weeks.

## 2016-06-27 NOTE — Progress Notes (Signed)
Pre visit review using our clinic review tool, if applicable. No additional management support is needed unless otherwise documented below in the visit note. 

## 2016-07-16 ENCOUNTER — Ambulatory Visit (INDEPENDENT_AMBULATORY_CARE_PROVIDER_SITE_OTHER): Payer: BLUE CROSS/BLUE SHIELD | Admitting: Family Medicine

## 2016-07-16 VITALS — BP 144/90 | HR 93 | Temp 98.2°F | Wt 130.0 lb

## 2016-07-16 DIAGNOSIS — I10 Essential (primary) hypertension: Secondary | ICD-10-CM | POA: Diagnosis not present

## 2016-07-16 LAB — BASIC METABOLIC PANEL
BUN: 14 mg/dL (ref 6–23)
CO2: 30 mEq/L (ref 19–32)
Calcium: 9.7 mg/dL (ref 8.4–10.5)
Chloride: 102 mEq/L (ref 96–112)
Creatinine, Ser: 0.67 mg/dL (ref 0.40–1.20)
GFR: 100.53 mL/min (ref >60.00)
Glucose, Bld: 93 mg/dL (ref 70–99)
Potassium: 3.6 mEq/L (ref 3.5–5.1)
Sodium: 139 mEq/L (ref 135–145)

## 2016-07-16 NOTE — Progress Notes (Signed)
Subjective:   Patient ID: Kristin Hunter, female    DOB: 15-Jun-1970, 46 y.o.   MRN: [id number]  Kristin Hunter is a pleasant 46 y.o. year old female who presents to clinic today with Elevated BP (Has been checking at home. Average numbers have been 120s-130s/80s. Just drove here from [place].)  on 07/16/2016  HPI:  Saw her on 06/27/16 for ? HTN.  Note reviewed.  Given multiple elevated BP readings along with family history and increased salt intake, we did start her on low dose HCTZ, advised DASH diet and to follow up with me here today.  BPs at home have been in 120s/ 80s.  BP Readings from Last 3 Encounters:  07/16/16 (!) 144/90  06/27/16 (!) 160/108  09/06/15 118/84   Lab Results  Component Value Date   CREATININE 0.65 09/06/2015   Lab Results  Component Value Date   NA 138 09/06/2015   K 3.7 09/06/2015   CL 104 09/06/2015   CO2 31 09/06/2015   Current Outpatient Prescriptions on File Prior to Visit  Medication Sig Dispense Refill  . estradiol (VIVELLE-DOT) 0.025 MG/24HR Place onto the skin.    . fluticasone (FLONASE) 50 MCG/ACT nasal spray Place 2 sprays into both nostrils daily.    . hydrochlorothiazide (MICROZIDE) 12.5 MG capsule Take 1 capsule (12.5 mg total) by mouth daily. 30 capsule 3  . Multiple Vitamin (MULTI-VITAMINS) TABS Take by mouth.    Marland Kitchen OVER THE COUNTER MEDICATION daily. BIOTIN ORAL     No current facility-administered medications on file prior to visit.     No Known Allergies  No past medical history on file.  No past surgical history on file.  Family History  Problem Relation Age of Onset  . Breast cancer Maternal Aunt     late 27's    Social History   Social History  . Marital status: Married    Spouse name: N/A  . Number of children: N/A  . Years of education: N/A   Occupational History  . Not on file.   Social History Main Topics  . Smoking status: Never Smoker  . Smokeless tobacco: Never Used  . Alcohol use No  . Drug use:  No  . Sexual activity: Not on file   Other Topics Concern  . Not on file   Social History Narrative  . No narrative on file   The PMH, PSH, Social History, Family History, Medications, and allergies have been reviewed in [facility], and have been updated if relevant.   Review of Systems  Constitutional: Negative.   Eyes: Negative.   Respiratory: Negative.   Cardiovascular: Negative.   Musculoskeletal: Negative.   Neurological: Negative.   Hematological: Negative.   Psychiatric/Behavioral: Negative.   All other systems reviewed and are negative.      Objective:    BP (!) 144/90 (BP Location: Right Arm, Patient Position: Sitting, Cuff Size: Normal)   Pulse 93   Temp 98.2 F (36.8 C) (Oral)   Wt 130 lb (59 kg)   LMP 11/27/2010   SpO2 98%   BMI 23.03 kg/m   Wt Readings from Last 3 Encounters:  07/16/16 130 lb (59 kg)  06/27/16 131 lb (59.4 kg)  09/06/15 135 lb 8 oz (61.5 kg)     Physical Exam   General:  Well-developed,well-nourished,in no acute distress; alert,appropriate and cooperative throughout examination Head:  normocephalic and atraumatic.   Eyes:  vision grossly intact, PERRL Ears:  R ear normal and L ear normal  externally, TMs clear bilaterally Nose:  no external deformity.   Mouth:  good dentition.   Neck:  No deformities, masses, or tenderness noted. Lungs:  Normal respiratory effort, chest expands symmetrically. Lungs are clear to auscultation, no crackles or wheezes. Heart:  Normal rate and regular rhythm. S1 and S2 normal without gallop, murmur, click, rub or other extra sounds. Msk:  No deformity or scoliosis noted of thoracic or lumbar spine.   Extremities:  No clubbing, cyanosis, edema, or deformity noted with normal full range of motion of all joints.   Neurologic:  alert & oriented X3 and gait normal.   Skin:  Intact without suspicious lesions or rashes Cervical Nodes:  No lymphadenopathy noted Axillary Nodes:  No palpable lymphadenopathy Psych:   Cognition and judgment appear intact. Alert and cooperative with normal attention span and concentration. No apparent delusions, illusions, hallucinations       Assessment & Plan:   No diagnosis found. No Follow-up on file.

## 2016-07-16 NOTE — Progress Notes (Signed)
Pre visit review using our clinic review tool, if applicable. No additional management support is needed unless otherwise documented below in the visit note. 

## 2016-07-16 NOTE — Assessment & Plan Note (Signed)
Well controlled. Check BMET today. Continue current dose of HCTZ. The patient indicates understanding of these issues and agrees with the plan.

## 2016-07-19 DIAGNOSIS — R55 Syncope and collapse: Secondary | ICD-10-CM | POA: Diagnosis not present

## 2016-08-27 ENCOUNTER — Other Ambulatory Visit: Payer: Self-pay

## 2016-08-27 MED ORDER — HYDROCHLOROTHIAZIDE 12.5 MG PO CAPS: 12.5000 mg | ORAL_CAPSULE | Freq: Every day | ORAL | 0 refills | Status: DC

## 2016-08-27 NOTE — Telephone Encounter (Signed)
Pharmacy faxes request for 90 day supply on patient's HCTZ 12.5mg  R/X.  Last OV: 07/16/16 Last Refill: 06/27/16 #30, 3 refills.  Will refill HCTZ 12.5mg  for #90, 0 additional refills.  Also, note made to pharmacy to d/c original script sent on 06/27/16.

## 2016-09-26 ENCOUNTER — Encounter: Payer: Self-pay | Admitting: Family Medicine

## 2016-09-26 ENCOUNTER — Ambulatory Visit (INDEPENDENT_AMBULATORY_CARE_PROVIDER_SITE_OTHER): Payer: BLUE CROSS/BLUE SHIELD | Admitting: Family Medicine

## 2016-09-26 VITALS — BP 122/80 | HR 100 | Ht 63.0 in | Wt 127.0 lb

## 2016-09-26 DIAGNOSIS — I10 Essential (primary) hypertension: Secondary | ICD-10-CM | POA: Diagnosis not present

## 2016-09-26 LAB — COMPREHENSIVE METABOLIC PANEL
ALT: 10 U/L (ref 0–35)
AST: 13 U/L (ref 0–37)
Albumin: 4.1 g/dL (ref 3.5–5.2)
Alkaline Phosphatase: 48 U/L (ref 39–117)
BUN: 16 mg/dL (ref 6–23)
CO2: 30 mEq/L (ref 19–32)
Calcium: 9.3 mg/dL (ref 8.4–10.5)
Chloride: 103 mEq/L (ref 96–112)
Creatinine, Ser: 0.72 mg/dL (ref 0.40–1.20)
GFR: 92.44 mL/min (ref >60.00)
Glucose, Bld: 83 mg/dL (ref 70–99)
Potassium: 3.5 mEq/L (ref 3.5–5.1)
Sodium: 140 mEq/L (ref 135–145)
Total Bilirubin: 0.5 mg/dL (ref 0.2–1.2)
Total Protein: 7.3 g/dL (ref 6.0–8.3)

## 2016-09-26 LAB — TSH: TSH: 0.83 u[IU]/mL (ref 0.35–4.50)

## 2016-09-26 NOTE — Patient Instructions (Signed)
Great to see you.  If you want to 1/2 tablet of HCTZ or even stopping, just keep a log and keep me updated.

## 2016-09-26 NOTE — Progress Notes (Signed)
   Subjective:   Patient ID: Kristin Hunter, female    DOB: 01/23/1971, 46 y.o.   MRN: [id number]  Kristin Hunter is a pleasant 46 y.o. year old female who presents to clinic today with Hypertension  on 09/26/2016  HPI:  HTN- started HCTZ 12.5 mg daily in 06/2016. Has completely changed her lifestyle- doing yoga, cut out salt. Checks BP regularly  Wonders if she can d/c rx since BP is improving.  Wt Readings from Last 3 Encounters:  09/26/16 127 lb (57.6 kg)  07/16/16 130 lb (59 kg)  06/27/16 131 lb (59.4 kg)      Current Outpatient Prescriptions on File Prior to Visit  Medication Sig Dispense Refill  . estradiol (VIVELLE-DOT) 0.025 MG/24HR Place onto the skin.    . fluticasone (FLONASE) 50 MCG/ACT nasal spray Place 2 sprays into both nostrils daily.    . hydrochlorothiazide (MICROZIDE) 12.5 MG capsule Take 1 capsule (12.5 mg total) by mouth daily. 90 capsule 0  . Multiple Vitamin (MULTI-VITAMINS) TABS Take by mouth.    Marland Kitchen. OVER THE COUNTER MEDICATION daily. BIOTIN ORAL     No current facility-administered medications on file prior to visit.     No Known Allergies  No past medical history on file.  No past surgical history on file.  Family History  Problem Relation Age of Onset  . Breast cancer Maternal Aunt        late 5140's    Social History   Social History  . Marital status: Married    Spouse name: N/A  . Number of children: N/A  . Years of education: N/A   Occupational History  . Not on file.   Social History Main Topics  . Smoking status: Never Smoker  . Smokeless tobacco: Never Used  . Alcohol use No  . Drug use: No  . Sexual activity: Not on file   Other Topics Concern  . Not on file   Social History Narrative  . No narrative on file   The PMH, PSH, Social History, Family History, Medications, and allergies [facility], and have been updated if relevant.   Review of Systems  Constitutional: Negative.   Respiratory: Negative.     Cardiovascular: Negative.   Gastrointestinal: Negative.   Musculoskeletal: Negative.   Allergic/Immunologic: Negative.   Neurological: Negative.   Hematological: Negative.   Psychiatric/Behavioral: Negative.   All other systems reviewed and are negative.      Objective:    BP 122/80   Pulse 100   Ht 5\' 3"  (1.6 m)   Wt 127 lb (57.6 kg)   LMP 11/27/2010   SpO2 98%   BMI 22.50 kg/m    Physical Exam  Constitutional: She is oriented to person, place, and time. She appears well-developed and well-nourished. No distress.  HENT:  Head: Normocephalic and atraumatic.  Neck: Normal range of motion. Neck supple.  Cardiovascular: Normal rate and regular rhythm.   Pulmonary/Chest: Effort normal and breath sounds normal.  Musculoskeletal: Normal range of motion.  Neurological: She is alert and oriented to person, place, and time. No cranial nerve deficit.  Skin: Skin is warm and dry. She is not diaphoretic.  Psychiatric: She has a normal mood and affect. Her behavior is normal. Judgment and thought content normal.  Nursing note and vitals reviewed.         Assessment & Plan:   Essential hypertension No Follow-up on file.

## 2016-09-26 NOTE — Assessment & Plan Note (Signed)
Improved control. Discussed trial of 1/2 tab of HCTZ (6.25 mg daily), keeping a log. Call or return to clinic prn if these symptoms worsen or fail to improve as anticipated. The patient indicates understanding of these issues and agrees with the plan.

## 2016-10-24 DIAGNOSIS — J342 Deviated nasal septum: Secondary | ICD-10-CM | POA: Diagnosis not present

## 2016-10-29 DIAGNOSIS — F4322 Adjustment disorder with anxiety: Secondary | ICD-10-CM | POA: Diagnosis not present

## 2016-10-31 NOTE — Discharge Instructions (Signed)
[facility] °[facility] °ENDOSCOPIC SINUS SURGERY °[facility] ° °What is Functional Endoscopic Sinus Surgery? ° The Surgery involves making the natural openings of the sinuses larger by removing the bony partitions that separate the sinuses from the nasal cavity.  The natural sinus lining is preserved as much as possible to allow the sinuses to resume normal function after the surgery.  In some patients nasal polyps (excessively swollen lining of the sinuses) may be removed to relieve obstruction of the sinus openings.  The surgery is performed through the nose using lighted scopes, which eliminates the need for incisions on the face.  A septoplasty is a different procedure which is sometimes performed with sinus surgery.  It involves straightening the boy partition that separates the two sides of your nose.  A crooked or deviated septum may need repair if is obstructing the sinuses or nasal airflow.  Turbinate reduction is also often performed during sinus surgery.  The turbinates are bony proturberances from the side walls of the nose which swell and can obstruct the nose in patients with sinus and allergy problems.  Their size can be surgically reduced to help relieve nasal obstruction. ° °What Can Sinus Surgery Do For Me? ° Sinus surgery can reduce the frequency of sinus infections requiring antibiotic treatment.  This can provide improvement in nasal congestion, post-nasal drainage, facial pressure and nasal obstruction.  Surgery will NOT prevent you from ever having an infection again, so it usually only for patients who get infections 4 or more times yearly requiring antibiotics, or for infections that do not clear with antibiotics.  It will not cure nasal allergies, so patients with allergies may still require medication to treat their allergies after surgery. Surgery may improve headaches related to sinusitis, however, some people will continue to  require medication to control sinus headaches related to allergies.  Surgery will do nothing for other forms of headache (migraine, tension or cluster). ° °What Are the Risks of Endoscopic Sinus Surgery? ° Current techniques allow surgery to be performed safely with little risk, however, there are rare complications that patients should be aware of.  Because the sinuses are located around the eyes, there is risk of eye injury, including blindness, though again, this would be quite rare. This is usually a result of bleeding behind the eye during surgery, which puts the vision oat risk, though there are treatments to protect the vision and prevent permanent disrupted by surgery causing a leak of the spinal fluid that surrounds the brain.  More serious complications would include bleeding inside the brain cavity or damage to the brain.  Again, all of these complications are uncommon, and spinal fluid leaks can be safely managed surgically if they occur.  The most common complication of sinus surgery is bleeding from the nose, which may require packing or cauterization of the nose.  Continued sinus have polyps may experience recurrence of the polyps requiring revision surgery.  Alterations of sense of smell or injury to the tear ducts are also rare complications.  ° °What is the Surgery Like, and what is the Recovery? ° The Surgery usually takes a couple of hours to perform, and is usually performed under a general anesthetic (completely asleep).  Patients are usually discharged home after a couple of hours.  Sometimes during surgery it is necessary to pack the nose to control bleeding, and the packing is left in place for 24 - 48 hours, and removed by your surgeon.    If a septoplasty was performed during the procedure, there is often a splint placed which must be removed after 5-7 days.   °Discomfort: Pain is usually mild to moderate, and can be controlled by prescription pain medication or acetaminophen (Tylenol).   Aspirin, Ibuprofen (Advil, Motrin), or Naprosyn (Aleve) should be avoided, as they can cause increased bleeding.  Most patients feel sinus pressure like they have a bad head cold for several days.  Sleeping with your head elevated can help reduce swelling and facial pressure, as can ice packs over the face.  A humidifier may be helpful to keep the mucous and blood from drying in the nose.  ° °Diet: There are no specific diet restrictions, however, you should generally start with clear liquids and a light diet of bland foods because the anesthetic can cause some nausea.  Advance your diet depending on how your stomach feels.  Taking your pain medication with food will often help reduce stomach upset which pain medications can cause. ° °Nasal Saline Irrigation: It is important to remove blood clots and dried mucous from the nose as it is healing.  This is done by having you irrigate the nose at least 3 - 4 times daily with a salt water solution.  We recommend using NeilMed Sinus Rinse (available at the drug store).  Fill the squeeze bottle with the solution, bend over a sink, and insert the tip of the squeeze bottle into the nose ½ of an inch.  Point the tip of the squeeze bottle towards the inside corner of the eye on the same side your irrigating.  Squeeze the bottle and gently irrigate the nose.  If you bend forward as you do this, most of the fluid will flow back out of the nose, instead of down your throat.   The solution should be warm, near body temperature, when you irrigate.   Each time you irrigate, you should use a full squeeze bottle.  ° °Note that if you are instructed to use Nasal Steroid Sprays at any time after your surgery, irrigate with saline BEFORE using the steroid spray, so you do not wash it all out of the nose. °Another product, Nasal Saline Gel (such as AYR Nasal Saline Gel) can be applied in each nostril 3 - 4 times daily to moisture the nose and reduce scabbing or crusting. ° °Bleeding:   Bloody drainage from the nose can be expected for several days, and patients are instructed to irrigate their nose frequently with salt water to help remove mucous and blood clots.  The drainage may be dark red or brown, though some fresh blood may be seen intermittently, especially after irrigation.  Do not blow you nose, as bleeding may occur. If you must sneeze, keep your mouth open to allow air to escape through your mouth. ° °If heavy bleeding occurs: Irrigate the nose with saline to rinse out clots, then spray the nose 3 - 4 times with Afrin Nasal Decongestant Spray.  The spray will constrict the blood vessels to slow bleeding.  Pinch the lower half of your nose shut to apply pressure, and lay down with your head elevated.  Ice packs over the nose may help as well. If bleeding persists despite these measures, you should notify your doctor.  Do not use the Afrin routinely to control nasal congestion after surgery, as it can result in worsening congestion and may affect healing.  ° ° ° °Activity: Return to work varies among patients. Most patients will be   out of work at least 5 - 7 days to recover.  Patient may return to work after they are off of narcotic pain medication, and feeling well enough to perform the functions of their job.  Patients must avoid heavy lifting (over 10 pounds) or strenuous physical for 2 weeks after surgery, so your employer may need to assign you to light duty, or keep you out of work longer if light duty is not possible.  NOTE: you should not drive, operate dangerous machinery, do any mentally demanding tasks or make any important legal or financial decisions while on narcotic pain medication and recovering from the general anesthetic.  °  °Call Your Doctor Immediately if You Have Any of the Following: °1. Bleeding that you cannot control with the above measures °2. Loss of vision, double vision, bulging of the eye or black eyes. °3. Fever over 101 degrees °4. Neck stiffness with  severe headache, fever, nausea and change in mental state. °You are always encourage to call anytime with concerns, however, please call with requests for pain medication refills during office hours. ° °Office Endoscopy: During follow-up visits your doctor will remove any packing or splints that may have been placed and evaluate and clean your sinuses endoscopically.  Topical anesthetic will be used to make this as comfortable as possible, though you may want to take your pain medication prior to the visit.  How often this will need to be done varies from patient to patient.  After complete recovery from the surgery, you may need follow-up endoscopy from time to time, particularly if there is concern of recurrent infection or nasal polyps. ° ° °General Anesthesia, Adult, Care After °These instructions provide you with information about caring for yourself after your procedure. Your health care provider may also give you more specific instructions. Your treatment has been planned according to current medical practices, but problems sometimes occur. Call your health care provider if you have any problems or questions after your procedure. °What can I expect after the procedure? °After the procedure, it is common to have: °· Vomiting. °· A sore throat. °· Mental slowness. ° °It is common to feel: °· Nauseous. °· Cold or shivery. °· Sleepy. °· Tired. °· Sore or achy, even in parts of your body where you did not have surgery. ° °Follow these instructions at home: °For at least 24 hours after the procedure: °· Do not: °? Participate in activities where you could fall or become injured. °? Drive. °? Use heavy machinery. °? Drink alcohol. °? Take sleeping pills or medicines that cause drowsiness. °? Make important decisions or sign legal documents. °? Take care of children on your own. °· Rest. °Eating and drinking °· If you vomit, drink water, juice, or soup when you can drink without vomiting. °· Drink enough fluid to  keep your urine clear or pale yellow. °· Make sure you have little or no nausea before eating solid foods. °· Follow the diet recommended by your health care provider. °General instructions °· Have a responsible adult stay with you until you are awake and alert. °· Return to your normal activities as told by your health care provider. Ask your health care provider what activities are safe for you. °· Take over-the-counter and prescription medicines only as told by your health care provider. °· If you smoke, do not smoke without supervision. °· Keep all follow-up visits as told by your health care provider. This is important. °Contact a health care provider if: °· You   continue to have nausea or vomiting at home, and medicines are not helpful. °· You cannot drink fluids or start eating again. °· You cannot urinate after 8-12 hours. °· You develop a skin rash. °· You have fever. °· You have increasing redness at the site of your procedure. °Get help right away if: °· You have difficulty breathing. °· You have chest pain. °· You have unexpected bleeding. °· You feel that you are having a life-threatening or urgent problem. °This information is not intended to replace advice given to you by your health care provider. Make sure you discuss any questions you have with your health care provider. °Document Released: 06/04/2000 Document Revised: 08/01/2015 Document Reviewed: 02/10/2015 °Elsevier Interactive Patient Education © 2018 [employer] ° °

## 2016-11-02 ENCOUNTER — Encounter
Admission: RE | Disposition: A | Discharge status: Home / Self Care | Payer: Self-pay | Source: Ambulatory Visit | Attending: Unknown Physician Specialty

## 2016-11-02 ENCOUNTER — Ambulatory Visit
Admission: RE | Admit: 2016-11-02 | Discharge: 2016-11-02 | Disposition: A | Discharge status: Home / Self Care | Payer: BLUE CROSS/BLUE SHIELD | Source: Ambulatory Visit | Attending: Unknown Physician Specialty | Admitting: Unknown Physician Specialty

## 2016-11-02 ENCOUNTER — Ambulatory Visit: Payer: BLUE CROSS/BLUE SHIELD | Admitting: Anesthesiology

## 2016-11-02 DIAGNOSIS — J342 Deviated nasal septum: Secondary | ICD-10-CM | POA: Diagnosis not present

## 2016-11-02 DIAGNOSIS — Z8349 Family history of other endocrine, nutritional and metabolic diseases: Secondary | ICD-10-CM | POA: Insufficient documentation

## 2016-11-02 DIAGNOSIS — Z8249 Family history of ischemic heart disease and other diseases of the circulatory system: Secondary | ICD-10-CM | POA: Diagnosis not present

## 2016-11-02 DIAGNOSIS — Z90710 Acquired absence of both cervix and uterus: Secondary | ICD-10-CM | POA: Insufficient documentation

## 2016-11-02 DIAGNOSIS — I10 Essential (primary) hypertension: Secondary | ICD-10-CM | POA: Diagnosis not present

## 2016-11-02 DIAGNOSIS — D649 Anemia, unspecified: Secondary | ICD-10-CM | POA: Diagnosis not present

## 2016-11-02 DIAGNOSIS — J3489 Other specified disorders of nose and nasal sinuses: Secondary | ICD-10-CM | POA: Diagnosis not present

## 2016-11-02 DIAGNOSIS — Z79899 Other long term (current) drug therapy: Secondary | ICD-10-CM | POA: Insufficient documentation

## 2016-11-02 DIAGNOSIS — Z9049 Acquired absence of other specified parts of digestive tract: Secondary | ICD-10-CM | POA: Insufficient documentation

## 2016-11-02 DIAGNOSIS — J343 Hypertrophy of nasal turbinates: Secondary | ICD-10-CM | POA: Diagnosis not present

## 2016-11-02 DIAGNOSIS — R51 Headache: Secondary | ICD-10-CM | POA: Diagnosis not present

## 2016-11-02 DIAGNOSIS — Z8261 Family history of arthritis: Secondary | ICD-10-CM | POA: Insufficient documentation

## 2016-11-02 HISTORY — DX: Headache: R51

## 2016-11-02 HISTORY — DX: Thyrotoxicosis, unspecified without thyrotoxic crisis or storm: E05.90

## 2016-11-02 HISTORY — PX: SEPTOPLASTY: SHX2393

## 2016-11-02 HISTORY — PX: NASAL TURBINATE REDUCTION: SHX2072

## 2016-11-02 HISTORY — DX: Headache, unspecified: R51.9

## 2016-11-02 SURGERY — SEPTOPLASTY, NOSE
Anesthesia: General | Laterality: Bilateral | Wound class: Clean Contaminated

## 2016-11-02 MED ORDER — PHENYLEPHRINE HCL 0.5 % NA SOLN
NASAL | Status: DC | PRN
  Administered 2016-11-02: 15 mL via TOPICAL

## 2016-11-02 MED ORDER — ACETAMINOPHEN 160 MG/5ML PO SOLN: 325.0000 mg | ORAL | Status: DC | PRN

## 2016-11-02 MED ORDER — OXYCODONE HCL 5 MG/5ML PO SOLN
5.0000 mg | Freq: Once | ORAL | Status: AC | PRN
Start: 2016-11-02 — End: 2016-11-02

## 2016-11-02 MED ORDER — LIDOCAINE HCL (CARDIAC) 20 MG/ML IV SOLN
INTRAVENOUS | Status: DC | PRN
  Administered 2016-11-02: 50 mg via INTRAVENOUS

## 2016-11-02 MED ORDER — HYDROMORPHONE HCL 1 MG/ML IJ SOLN
0.2500 mg | INTRAMUSCULAR | Status: DC | PRN
  Administered 2016-11-02: 0.25 mg via INTRAVENOUS
  Administered 2016-11-02: 0.5 mg via INTRAVENOUS

## 2016-11-02 MED ORDER — ONDANSETRON HCL 4 MG/2ML IJ SOLN
INTRAMUSCULAR | Status: DC | PRN
  Administered 2016-11-02: 4 mg via INTRAVENOUS

## 2016-11-02 MED ORDER — MIDAZOLAM HCL 5 MG/5ML IJ SOLN
INTRAMUSCULAR | Status: DC | PRN
  Administered 2016-11-02: 2 mg via INTRAVENOUS

## 2016-11-02 MED ORDER — ONDANSETRON HCL 4 MG/2ML IJ SOLN: 4.0000 mg | Freq: Once | INTRAMUSCULAR | Status: DC | PRN

## 2016-11-02 MED ORDER — OXYMETAZOLINE HCL 0.05 % NA SOLN
6.0000 | Freq: Once | NASAL | Status: AC
  Administered 2016-11-02: 6 via NASAL

## 2016-11-02 MED ORDER — LIDOCAINE-EPINEPHRINE 1 %-1:100000 IJ SOLN
INTRAMUSCULAR | Status: DC | PRN
  Administered 2016-11-02: 10 mL

## 2016-11-02 MED ORDER — DEXAMETHASONE SODIUM PHOSPHATE 4 MG/ML IJ SOLN
INTRAMUSCULAR | Status: DC | PRN
  Administered 2016-11-02: 8 mg via INTRAVENOUS

## 2016-11-02 MED ORDER — FENTANYL CITRATE (PF) 100 MCG/2ML IJ SOLN
INTRAMUSCULAR | Status: DC | PRN
  Administered 2016-11-02: 100 ug via INTRAVENOUS

## 2016-11-02 MED ORDER — ACETAMINOPHEN 325 MG PO TABS: 325.0000 mg | ORAL_TABLET | ORAL | Status: DC | PRN

## 2016-11-02 MED ORDER — ACETAMINOPHEN 10 MG/ML IV SOLN
1000.0000 mg | Freq: Once | INTRAVENOUS | Status: AC
  Administered 2016-11-02: 1000 mg via INTRAVENOUS

## 2016-11-02 MED ORDER — OXYCODONE-ACETAMINOPHEN 5-325 MG PO TABS: 1.0000 | ORAL_TABLET | ORAL | 0 refills | Status: DC | PRN

## 2016-11-02 MED ORDER — SUCCINYLCHOLINE CHLORIDE 20 MG/ML IJ SOLN
INTRAMUSCULAR | Status: DC | PRN
  Administered 2016-11-02: 100 mg via INTRAVENOUS

## 2016-11-02 MED ORDER — EPHEDRINE SULFATE 50 MG/ML IJ SOLN
INTRAMUSCULAR | Status: DC | PRN
  Administered 2016-11-02 (×2): 5 mg via INTRAVENOUS

## 2016-11-02 MED ORDER — PROPOFOL 10 MG/ML IV BOLUS
INTRAVENOUS | Status: DC | PRN
  Administered 2016-11-02: 150 mg via INTRAVENOUS

## 2016-11-02 MED ORDER — OXYCODONE HCL 5 MG PO TABS
5.0000 mg | ORAL_TABLET | Freq: Once | ORAL | Status: AC | PRN
  Administered 2016-11-02: 5 mg via ORAL

## 2016-11-02 MED ORDER — SULFAMETHOXAZOLE-TRIMETHOPRIM 400-80 MG PO TABS: 1.0000 | ORAL_TABLET | Freq: Two times a day (BID) | ORAL | 0 refills | Status: DC

## 2016-11-02 MED ORDER — LACTATED RINGERS IV SOLN
INTRAVENOUS | Status: DC
  Administered 2016-11-02: 08:00:00 via INTRAVENOUS

## 2016-11-02 SURGICAL SUPPLY — 21 items
COAG SUCT 10F 3.5MM HAND CTRL (MISCELLANEOUS) ×2 IMPLANT
DRAPE HEAD BAR (DRAPES) ×2 IMPLANT
DRESSING NASL FOAM PST OP SINU (MISCELLANEOUS) ×2 IMPLANT
DRSG NASAL FOAM POST OP SINU (MISCELLANEOUS) ×4
GLOVE BIO SURGEON STRL SZ7.5 (GLOVE) ×4 IMPLANT
HANDLE YANKAUER SUCT BULB TIP (MISCELLANEOUS) ×2 IMPLANT
KIT ROOM TURNOVER OR (KITS) ×2 IMPLANT
NEEDLE HYPO 25GX1X1/2 BEV (NEEDLE) ×2 IMPLANT
PACK DRAPE NASAL/ENT (PACKS) ×2 IMPLANT
PAD GROUND ADULT SPLIT (MISCELLANEOUS) ×2 IMPLANT
SPLINT NASAL SEPTAL BLV .50 ST (MISCELLANEOUS) ×2 IMPLANT
SPONGE NEURO XRAY DETECT 1X3 (DISPOSABLE) ×2 IMPLANT
STRAP BODY AND KNEE 60X3 (MISCELLANEOUS) ×2 IMPLANT
SUT CHROMIC 3-0 (SUTURE) ×1
SUT CHROMIC 3-0 KS 27XMFL CR (SUTURE) ×1
SUT ETHILON 3-0 KS 30 BLK (SUTURE) ×2 IMPLANT
SUT PLAIN GUT 4-0 (SUTURE) ×2 IMPLANT
SUTURE CHRMC 3-0 KS 27XMFL CR (SUTURE) ×1 IMPLANT
SYRINGE 10CC LL (SYRINGE) ×2 IMPLANT
TOWEL OR 17X26 4PK STRL BLUE (TOWEL DISPOSABLE) ×2 IMPLANT
WATER STERILE IRR 250ML POUR (IV SOLUTION) ×2 IMPLANT

## 2016-11-02 NOTE — Transfer of Care (Signed)
Immediate Anesthesia Transfer of Care Note  Patient: Kristin Hunter  Procedure(s) Performed: Procedure(s): SEPTOPLASTY (Bilateral) TURBINATE REDUCTION/SUBMUCOSAL RESECTION (Bilateral)  Patient Location: PACU  Anesthesia Type: General  Level of Consciousness: awake, alert  and patient cooperative  Airway and Oxygen Therapy: Patient Spontanous Breathing and Patient connected to supplemental oxygen  Post-op Assessment: Post-op Vital signs reviewed, Patient's Cardiovascular Status Stable, Respiratory Function Stable, Patent Airway and No signs of Nausea or vomiting  Post-op Vital Signs: Reviewed and stable  Complications: No apparent anesthesia complications

## 2016-11-02 NOTE — Op Note (Signed)
PREOPERATIVE DIAGNOSIS:  Chronic nasal obstruction.  POSTOPERATIVE DIAGNOSIS:  Chronic nasal obstruction.  SURGEON:  Davina Poke, M.D.  NAME OF PROCEDURE:  1. Nasal septoplasty. 2. Submucous resection of inferior turbinates.  OPERATIVE FINDINGS:  Severe nasal septal deformity, hypertrophy of the inferior turbinates.   DESCRIPTION OF THE PROCEDURE:  Kristin Hunter was identified in the holding area and taken to the operating room and placed in the supine position.  After general endotracheal anesthesia was induced, the table was turned 45 degrees and the patient was placed in a semi-Fowler position.  The nose was then topically anesthetized with Lidocaine, cotton pledgets were placed within each nostril. After approximately 5 minutes, this was removed at which time a local anesthetic of 1% Lidocaine 1:100,000 units of Epinephrine was used to inject the inferior turbinates in the nasal septum. A total of 10 ml was used. Examination of the nose showed a severe left nasal septal deformity and tremendous hypertrophied inferior turbinate.  Beginning on the right hand side a hemitransfixion incision was then created on the leading edge of the septum on the right.  A subperichondrial plane was elevated posteriorly on the left and taken back to the perpendicular plate of the ethmoid where subperiosteal plane was elevated posteriorly on the left. A large septal spur was identified on the left hand side impacting on the inferior turbinate.  An inferior rim of cartilage was removed anteriorly with care taken to leave an anterior strut to prevent nasal collapse. With this strut removed the perpendicular plate of the ethmoid was separated from the quadrangular cartilage. The large septal spur was removed.  The septum was then replaced in the midline. Reinspection through each nostril showed excellent reduction of the septal deformity. A left posterior inferior fenestration was then created to allow hematoma  drainage.  With the septoplasty completed, beginning on the left-hand side, a 15 blade was used to incise along the inferior edge of the inferior turbinate. A superior laterally based flap was then elevated. The underlying conchal bone of mucosa was excised using Knight scissors. The flap was then laid back over the turbinate stump and cauterized using suction cautery. In a similar fashion the submucous resection was performed on the right.  With the submucous resection completed bilaterally and no active bleeding, the hemitransfixion incision was then closed using two interrupted 3-0 chromic sutures.  Plastic nasal septal splints were placed within each nostril and affixed to the septum using a 3-0 nylon suture. Stammberger was then used beneath each inferior turbinate for hemostasis.    The patient tolerated the procedure well, was returned to anesthesia, extubated in the operating room, and taken to the recovery room in stable condition.    CULTURES:  None.  SPECIMENS:  None.  ESTIMATED BLOOD LOSS:  25 cc.  Kristin Hunter  11/02/2016  9:48 AM

## 2016-11-02 NOTE — H&P (Signed)
The patient's history has been reviewed, patient examined, no change in status, stable for surgery.  Questions were answered to the patients satisfaction.  

## 2016-11-02 NOTE — Anesthesia Postprocedure Evaluation (Signed)
Anesthesia Post Note  Patient: Kristin Hunter  Procedure(s) Performed: Procedure(s) (LRB): SEPTOPLASTY (Bilateral) TURBINATE REDUCTION/SUBMUCOSAL RESECTION (Bilateral)  Patient location during evaluation: PACU Anesthesia Type: General Level of consciousness: awake and alert Pain management: pain level controlled Vital Signs Assessment: post-procedure vital signs reviewed and stable Respiratory status: spontaneous breathing, nonlabored ventilation, respiratory function stable and patient connected to nasal cannula oxygen Cardiovascular status: blood pressure returned to baseline and stable Postop Assessment: no signs of nausea or vomiting Anesthetic complications: no    Alta Corning

## 2016-11-02 NOTE — Anesthesia Procedure Notes (Signed)
Procedure Name: Intubation Date/Time: 11/02/2016 9:14 AM Performed by: Londell Moh Pre-anesthesia Checklist: Patient identified, Emergency Drugs available, Suction available, Patient being monitored and Timeout performed Patient Re-evaluated:Patient Re-evaluated prior to induction Oxygen Delivery Method: Circle system utilized Preoxygenation: Pre-oxygenation with 100% oxygen Induction Type: IV induction Ventilation: Mask ventilation without difficulty Laryngoscope Size: Mac and 3 Grade View: Grade III Tube type: Oral Rae Tube size: 7.0 mm Number of attempts: 1 Airway Equipment and Method: Bougie stylet Placement Confirmation: ETT inserted through vocal cords under direct vision,  positive ETCO2 and breath sounds checked- equal and bilateral Tube secured with: Tape Dental Injury: Teeth and Oropharynx as per pre-operative assessment

## 2016-11-02 NOTE — Anesthesia Preprocedure Evaluation (Signed)
Anesthesia Evaluation  Patient identified by MRN, date of birth, ID band Patient awake    Reviewed: Allergy & Precautions, H&P , NPO status , Patient's Chart, lab work & pertinent test results, reviewed documented beta blocker date and time   Airway Mallampati: III  TM Distance: >3 FB Neck ROM: full    Dental no notable dental hx.    Pulmonary neg pulmonary ROS,    Pulmonary exam normal breath sounds clear to auscultation       Cardiovascular Exercise Tolerance: Good hypertension, On Medications Normal cardiovascular exam Rhythm:regular Rate:Normal     Neuro/Psych  Headaches, negative psych ROS   GI/Hepatic negative GI ROS, Neg liver ROS,   Endo/Other  negative endocrine ROSHyperthyroidism   Renal/GU negative Renal ROS  negative genitourinary   Musculoskeletal   Abdominal   Peds  Hematology negative hematology ROS (+)   Anesthesia Other Findings   Reproductive/Obstetrics negative OB ROS                             Anesthesia Physical Anesthesia Plan  ASA: II  Anesthesia Plan: General   Post-op Pain Management:    Induction:   PONV Risk Score and Plan:   Airway Management Planned:   Additional Equipment:   Intra-op Plan:   Post-operative Plan:   Informed Consent: I have reviewed the patients History and Physical, chart, labs and discussed the procedure including the risks, benefits and alternatives for the proposed anesthesia with the patient or authorized representative who has indicated his/her understanding and acceptance.   Dental Advisory Given  Plan Discussed with: CRNA  Anesthesia Plan Comments:         Anesthesia Quick Evaluation

## 2016-11-05 ENCOUNTER — Encounter: Payer: Self-pay | Admitting: Unknown Physician Specialty

## 2016-11-05 NOTE — Progress Notes (Addendum)
On postop call, pt states she felt miserable over the weekend and called Dr. Mikey Bussing office this morning to see if she could have splints removed today. patient has follow up appt tomorrow. patient states she has only done one sinus rinse, which was on Sunday, and it made her feel worse so she spoke with Dr. Jenne Campus who she says told her she didn't have to continue doing them. Roxicet prescription given postoperatively but patient has only taken tylenol, states pain is currently 10/10.

## 2016-11-09 DIAGNOSIS — F4322 Adjustment disorder with anxiety: Secondary | ICD-10-CM | POA: Diagnosis not present

## 2016-11-15 DIAGNOSIS — F4322 Adjustment disorder with anxiety: Secondary | ICD-10-CM | POA: Diagnosis not present

## 2016-11-29 DIAGNOSIS — F4322 Adjustment disorder with anxiety: Secondary | ICD-10-CM | POA: Diagnosis not present

## 2016-12-13 DIAGNOSIS — F4322 Adjustment disorder with anxiety: Secondary | ICD-10-CM | POA: Diagnosis not present

## 2016-12-22 ENCOUNTER — Other Ambulatory Visit: Payer: Self-pay | Admitting: Family Medicine

## 2017-01-16 DIAGNOSIS — Z7189 Other specified counseling: Secondary | ICD-10-CM | POA: Diagnosis not present

## 2017-01-16 DIAGNOSIS — F4322 Adjustment disorder with anxiety: Secondary | ICD-10-CM | POA: Diagnosis not present

## 2017-01-24 DIAGNOSIS — F4322 Adjustment disorder with anxiety: Secondary | ICD-10-CM | POA: Diagnosis not present

## 2017-02-19 DIAGNOSIS — F4322 Adjustment disorder with anxiety: Secondary | ICD-10-CM | POA: Diagnosis not present

## 2017-02-20 ENCOUNTER — Other Ambulatory Visit: Payer: Self-pay | Admitting: Obstetrics and Gynecology

## 2017-02-20 DIAGNOSIS — Z1231 Encounter for screening mammogram for malignant neoplasm of breast: Secondary | ICD-10-CM | POA: Diagnosis not present

## 2017-02-20 DIAGNOSIS — Z01419 Encounter for gynecological examination (general) (routine) without abnormal findings: Secondary | ICD-10-CM | POA: Diagnosis not present

## 2017-03-19 DIAGNOSIS — F4322 Adjustment disorder with anxiety: Secondary | ICD-10-CM | POA: Diagnosis not present

## 2017-03-27 ENCOUNTER — Other Ambulatory Visit: Payer: Self-pay

## 2017-03-27 MED ORDER — HYDROCHLOROTHIAZIDE 12.5 MG PO CAPS: ORAL_CAPSULE | ORAL | 0 refills | Status: DC

## 2017-04-05 DIAGNOSIS — N6321 Unspecified lump in the left breast, upper outer quadrant: Secondary | ICD-10-CM | POA: Diagnosis not present

## 2017-04-17 DIAGNOSIS — F4322 Adjustment disorder with anxiety: Secondary | ICD-10-CM | POA: Diagnosis not present

## 2017-04-22 DIAGNOSIS — L859 Epidermal thickening, unspecified: Secondary | ICD-10-CM | POA: Diagnosis not present

## 2017-04-22 DIAGNOSIS — L918 Other hypertrophic disorders of the skin: Secondary | ICD-10-CM | POA: Diagnosis not present

## 2017-04-22 DIAGNOSIS — L821 Other seborrheic keratosis: Secondary | ICD-10-CM | POA: Diagnosis not present

## 2017-04-26 ENCOUNTER — Ambulatory Visit
Admission: RE | Admit: 2017-04-26 | Discharge: 2017-04-26 | Disposition: A | Discharge status: Home / Self Care | Payer: BLUE CROSS/BLUE SHIELD | Source: Ambulatory Visit | Attending: Obstetrics and Gynecology | Admitting: Obstetrics and Gynecology

## 2017-04-26 DIAGNOSIS — Z1231 Encounter for screening mammogram for malignant neoplasm of breast: Secondary | ICD-10-CM

## 2017-04-30 ENCOUNTER — Other Ambulatory Visit: Payer: Self-pay | Admitting: Obstetrics and Gynecology

## 2017-04-30 DIAGNOSIS — N6320 Unspecified lump in the left breast, unspecified quadrant: Secondary | ICD-10-CM

## 2017-04-30 DIAGNOSIS — N6489 Other specified disorders of breast: Secondary | ICD-10-CM

## 2017-05-09 ENCOUNTER — Ambulatory Visit
Admission: RE | Admit: 2017-05-09 | Discharge: 2017-05-09 | Disposition: A | Discharge status: Home / Self Care | Payer: BLUE CROSS/BLUE SHIELD | Source: Ambulatory Visit | Attending: Obstetrics and Gynecology | Admitting: Obstetrics and Gynecology

## 2017-05-09 DIAGNOSIS — N6320 Unspecified lump in the left breast, unspecified quadrant: Secondary | ICD-10-CM

## 2017-05-09 DIAGNOSIS — R928 Other abnormal and inconclusive findings on diagnostic imaging of breast: Secondary | ICD-10-CM | POA: Diagnosis not present

## 2017-05-09 DIAGNOSIS — N6489 Other specified disorders of breast: Secondary | ICD-10-CM

## 2017-06-13 DIAGNOSIS — F4322 Adjustment disorder with anxiety: Secondary | ICD-10-CM | POA: Diagnosis not present

## 2017-06-24 ENCOUNTER — Other Ambulatory Visit: Payer: Self-pay | Admitting: Family Medicine

## 2017-07-18 DIAGNOSIS — F4322 Adjustment disorder with anxiety: Secondary | ICD-10-CM | POA: Diagnosis not present

## 2017-08-07 ENCOUNTER — Encounter: Payer: BLUE CROSS/BLUE SHIELD | Admitting: Family Medicine

## 2017-08-12 ENCOUNTER — Encounter: Payer: Self-pay | Admitting: Family Medicine

## 2017-08-12 ENCOUNTER — Ambulatory Visit (INDEPENDENT_AMBULATORY_CARE_PROVIDER_SITE_OTHER): Payer: BLUE CROSS/BLUE SHIELD | Admitting: Family Medicine

## 2017-08-12 VITALS — BP 128/92 | HR 75 | Temp 98.2°F | Ht 62.0 in | Wt 137.7 lb

## 2017-08-12 DIAGNOSIS — Z23 Encounter for immunization: Secondary | ICD-10-CM

## 2017-08-12 DIAGNOSIS — I10 Essential (primary) hypertension: Secondary | ICD-10-CM | POA: Diagnosis not present

## 2017-08-12 LAB — COMPREHENSIVE METABOLIC PANEL
ALT: 11 U/L (ref 0–35)
AST: 11 U/L (ref 0–37)
Albumin: 4.3 g/dL (ref 3.5–5.2)
Alkaline Phosphatase: 54 U/L (ref 39–117)
BUN: 19 mg/dL (ref 6–23)
CO2: 30 mEq/L (ref 19–32)
Calcium: 9.7 mg/dL (ref 8.4–10.5)
Chloride: 104 mEq/L (ref 96–112)
Creatinine, Ser: 0.67 mg/dL (ref 0.40–1.20)
GFR: 100.06 mL/min (ref >60.00)
Glucose, Bld: 87 mg/dL (ref 70–99)
Potassium: 3.9 mEq/L (ref 3.5–5.1)
Sodium: 141 mEq/L (ref 135–145)
Total Bilirubin: 0.3 mg/dL (ref 0.2–1.2)
Total Protein: 7.1 g/dL (ref 6.0–8.3)

## 2017-08-12 LAB — LIPID PANEL
Cholesterol: 128 mg/dL (ref 0–200)
HDL: 41.8 mg/dL (ref >39.00)
NonHDL: 86.18
Total CHOL/HDL Ratio: 3
Triglycerides: 207 mg/dL — ABNORMAL HIGH (ref 0.0–149.0)
VLDL: 41.4 mg/dL — ABNORMAL HIGH (ref 0.0–40.0)

## 2017-08-12 LAB — TSH: TSH: 0.54 u[IU]/mL (ref 0.35–4.50)

## 2017-08-12 LAB — LDL CHOLESTEROL, DIRECT: Direct LDL: 63 mg/dL

## 2017-08-12 MED ORDER — HYDROCHLOROTHIAZIDE 12.5 MG PO TABS: 6.2500 mg | ORAL_TABLET | ORAL | 3 refills | Status: DC

## 2017-08-12 NOTE — Patient Instructions (Addendum)
Great to see you. I will call you with your lab results from today and you can view them online.   Let's try HCTZ 6.25 mg every other day.  Keep me updated.

## 2017-08-12 NOTE — Addendum Note (Signed)
Addended by: Dianne DunARON, Alazne Quant M on: 08/12/2017 02:59 PM   Modules accepted: Orders

## 2017-08-12 NOTE — Progress Notes (Signed)
Subjective:   Patient ID: Kristin Hunter, female    DOB: 1970/05/29, 47 y.o.   MRN: [id number]  Kristin Hunter is a pleasant 47 y.o. year old female who presents to clinic today with Follow-up (F/U with HTN, last OV was 7.18.18., would like to discuss weaing off medication.)  on 08/12/2017  HPI:  HTN- has been slowly weaning off HCTZ. When I saw her last year, she had made life style changes and we decreased her HCTZ to 6.25 mg from 12.5 mg daily.  She is asking if she can now stop taking it.  She is joining a program through her employer, [employer] that starts next month for monitoring BP.  They will check her BP daily.    BP Readings from Last 3 Encounters:  08/12/17 (!) 128/92  11/02/16 (!) 148/86  09/26/16 122/80   Wt Readings from Last 3 Encounters:  08/12/17 137 lb 11.2 oz (62.5 kg)  11/02/16 124 lb (56.2 kg)  09/26/16 127 lb (57.6 kg)   Lab Results  Component Value Date   CREATININE 0.72 09/26/2016   Lab Results  Component Value Date   NA 140 09/26/2016   K 3.5 09/26/2016   CL 103 09/26/2016   CO2 30 09/26/2016     Current Outpatient Medications on File Prior to Visit  Medication Sig Dispense Refill  . co-enzyme Q-10 30 MG capsule Take 30 mg by mouth 3 (three) times daily.    Marland Kitchen estradiol (VIVELLE-DOT) 0.025 MG/24HR Place onto the skin.    . hydrochlorothiazide (MICROZIDE) 12.5 MG capsule TAKE 1 CAPSULE BY MOUTH EVERY DAY 90 capsule 0  . Multiple Vitamin (MULTI-VITAMINS) TABS Take by mouth.    Marland Kitchen OVER THE COUNTER MEDICATION daily. BIOTIN ORAL     . oxyCODONE-acetaminophen (ROXICET) 5-325 MG tablet Take 1-2 tablets by mouth every 4 (four) hours as needed. (Patient not taking: Reported on 08/12/2017) 40 tablet 0  . sulfamethoxazole-trimethoprim (BACTRIM) 400-80 MG tablet Take 1 tablet by mouth 2 (two) times daily. (Patient not taking: Reported on 08/12/2017) 20 tablet 0   No current facility-administered medications on file prior to visit.     No Known Allergies  Past  Medical History:  Diagnosis Date  . Headache   . Hyperthyroidism     Past Surgical History:  Procedure Laterality Date  . CHOLECYSTECTOMY    . NASAL TURBINATE REDUCTION Bilateral 11/02/2016   Procedure: TURBINATE REDUCTION/SUBMUCOSAL RESECTION;  Surgeon: Linus Salmons, MD;  Location: [facility];  Service: ENT;  Laterality: Bilateral;  . PARTIAL HYSTERECTOMY    . SEPTOPLASTY Bilateral 11/02/2016   Procedure: SEPTOPLASTY;  Surgeon: Linus Salmons, MD;  Location: [facility];  Service: ENT;  Laterality: Bilateral;    Family History  Problem Relation Age of Onset  . Breast cancer Maternal Aunt        late 4's    Social History   Socioeconomic History  . Marital status: Married    Spouse name: Not on file  . Number of children: Not on file  . Years of education: Not on file  . Highest education level: Not on file  Occupational History  . Not on file  Social Needs  . Financial resource strain: Not on file  . Food insecurity:    Worry: Not on file    Inability: Not on file  . Transportation needs:    Medical: Not on file    Non-medical: Not on file  Tobacco Use  . Smoking status: Never Smoker  .  Smokeless tobacco: Never Used  Substance and Sexual Activity  . Alcohol use: No  . Drug use: No  . Sexual activity: Not on file  Lifestyle  . Physical activity:    Days per week: Not on file    Minutes per session: Not on file  . Stress: Not on file  Relationships  . Social connections:    Talks on phone: Not on file    Gets together: Not on file    Attends religious service: Not on file    Active member of club or organization: Not on file    Attends meetings of clubs or organizations: Not on file    Relationship status: Not on file  . Intimate partner violence:    Fear of current or ex partner: Not on file    Emotionally abused: Not on file    Physically abused: Not on file    Forced sexual activity: Not on file  Other Topics Concern  . Not on  file  Social History Narrative  . Not on file   The PMH, PSH, Social History, Family History, Medications, and allergies [facility], and have been updated if relevant.   Review of Systems  Constitutional: Negative.   HENT: Negative.   Respiratory: Negative.   Cardiovascular: Negative.   Musculoskeletal: Negative.   Psychiatric/Behavioral: Negative.   All other systems reviewed and are negative.      Objective:    BP (!) 128/92 (BP Location: Left Arm, Patient Position: Sitting, Cuff Size: Normal)   Pulse 75   Temp 98.2 F (36.8 C) (Oral)   Ht 5\' 2"  (1.575 m)   Wt 137 lb 11.2 oz (62.5 kg)   LMP 11/27/2010   SpO2 99%   BMI 25.19 kg/m    Physical Exam   General:  Well-developed,well-nourished,in no acute distress; alert,appropriate and cooperative throughout examination Head:  normocephalic and atraumatic.   Eyes:  vision grossly intact, PERRL Ears:  R ear normal and L ear normal externally, TMs clear bilaterally Nose:  no external deformity.   Mouth:  good dentition.   Neck:  No deformities, masses, or tenderness noted. Lungs:  Normal respiratory effort, chest expands symmetrically. Lungs are clear to auscultation, no crackles or wheezes. Heart:  Normal rate and regular rhythm. S1 and S2 normal without gallop, murmur, click, rub or other extra sounds. Msk:  No deformity or scoliosis noted of thoracic or lumbar spine.   Extremities:  No clubbing, cyanosis, edema, or deformity noted with normal full range of motion of all joints.   Neurologic:  alert & oriented X3 and gait normal.   Skin:  Intact without suspicious lesions or rashes Psych:  Cognition and judgment appear intact. Alert and cooperative with normal attention span and concentration. No apparent delusions, illusions, hallucinations       Assessment & Plan:   Essential hypertension - Plan: Lipid panel, Comprehensive metabolic panel No follow-ups on file.

## 2017-08-12 NOTE — Assessment & Plan Note (Signed)
Continue to wean down- 6.25 mg every other day.  She will keep checking it at work.  May be able to wean off of it if it remains stable. The patient indicates understanding of these issues and agrees with the plan.

## 2017-08-12 NOTE — Addendum Note (Signed)
Addended by: Reuben LikesGANE, Jaxon Mynhier on: 08/12/2017 03:10 PM   Modules accepted: Orders

## 2017-08-20 DIAGNOSIS — F4322 Adjustment disorder with anxiety: Secondary | ICD-10-CM | POA: Diagnosis not present

## 2017-09-15 ENCOUNTER — Other Ambulatory Visit: Payer: Self-pay | Admitting: Family Medicine

## 2017-09-16 ENCOUNTER — Other Ambulatory Visit: Payer: Self-pay

## 2017-09-16 MED ORDER — HYDROCHLOROTHIAZIDE 12.5 MG PO TABS: 6.2500 mg | ORAL_TABLET | ORAL | 3 refills | Status: DC

## 2017-09-16 NOTE — Telephone Encounter (Signed)
I will send in the new dosage

## 2017-09-24 DIAGNOSIS — F4322 Adjustment disorder with anxiety: Secondary | ICD-10-CM | POA: Diagnosis not present

## 2017-10-03 ENCOUNTER — Telehealth: Payer: Self-pay | Admitting: Family Medicine

## 2017-10-03 DIAGNOSIS — I10 Essential (primary) hypertension: Secondary | ICD-10-CM

## 2017-10-03 NOTE — Telephone Encounter (Signed)
It sounds like she is saying that sleep apnea was already ruled out.  Is she asking to see cardiology?  I will place that referral and please let me know if I have not understood this correctly.

## 2017-10-03 NOTE — Telephone Encounter (Signed)
Spoke with the pt, she was wondering if something else maybe the cause of her BP being high all the times for ex: snoring or sleep apnea (pt had sleep apnea test years back and it was normal). Pt wants to see if specialist will be able to rule out if there is secondary cause of her high BP because her BP is high first thing in the morning 140/100? and It will stay high throughout the day unless she exercise it would be around 129/89. Pt changed her diet and lifestyle completely but it is not helping. Please advise.    Copied from CRM [phone]. Topic: Referral - Request >> Oct 02, 2017 12:47 PM Oneal GroutSebastian, Jennifer S wrote: Reason for CRM: Requesting referral to HTN Clinic for elevated BP

## 2017-10-04 NOTE — Telephone Encounter (Signed)
TA-Yes patient would like a referral to Cardiology/she is aware that she will be contacted to schedule/thx dmf

## 2017-10-14 DIAGNOSIS — G4733 Obstructive sleep apnea (adult) (pediatric): Secondary | ICD-10-CM | POA: Diagnosis not present

## 2017-10-30 DIAGNOSIS — F4322 Adjustment disorder with anxiety: Secondary | ICD-10-CM | POA: Diagnosis not present

## 2017-11-13 DIAGNOSIS — F4322 Adjustment disorder with anxiety: Secondary | ICD-10-CM | POA: Diagnosis not present

## 2017-11-24 NOTE — Progress Notes (Deleted)
Cardiology Office Note  Date:  11/24/2017   ID:  Kristin Hunter, DOB [birth year], MRN [id number]  PCP:  Dianne Dun, MD   No chief complaint on file.   HPI:    Refeerred by Dr. Dayton Martes for consulation of her HTN  On HCTZ Weight up 10 pounds past year   BP being high all the times for ex: snoring or sleep apnea (pt had sleep apnea test years back and it was normal). Pt wants to see if specialist will be able to rule out if there is secondary cause of her high BP because her BP is high first thing in the morning 140/100? and It will stay high throughout the day unless she exercise it would be around 129/89. Pt changed her diet and lifestyle completely but it is not helping.   LDL 63         PMH:   has a past medical history of Headache and Hyperthyroidism.  PSH:    Past Surgical History:  Procedure Laterality Date  . CHOLECYSTECTOMY    . NASAL TURBINATE REDUCTION Bilateral 11/02/2016   Procedure: TURBINATE REDUCTION/SUBMUCOSAL RESECTION;  Surgeon: Linus Salmons, MD;  Location: [facility];  Service: ENT;  Laterality: Bilateral;  . PARTIAL HYSTERECTOMY    . SEPTOPLASTY Bilateral 11/02/2016   Procedure: SEPTOPLASTY;  Surgeon: Linus Salmons, MD;  Location[facility];  Service: ENT;  Laterality: Bilateral;    Current Outpatient Medications  Medication Sig Dispense Refill  . co-enzyme Q-10 30 MG capsule Take 30 mg by mouth 3 (three) times daily.    Marland Kitchen estradiol (VIVELLE-DOT) 0.025 MG/24HR Place onto the skin.    . hydrochlorothiazide (HYDRODIURIL) 12.5 MG tablet Take 0.5 tablets (6.25 mg total) by mouth every other day. 30 tablet 3  . Multiple Vitamin (MULTI-VITAMINS) TABS Take by mouth.    Marland Kitchen OVER THE COUNTER MEDICATION daily. BIOTIN ORAL     . oxyCODONE-acetaminophen (ROXICET) 5-325 MG tablet Take 1-2 tablets by mouth every 4 (four) hours as needed. (Patient not taking: Reported on 08/12/2017) 40 tablet 0  . sulfamethoxazole-trimethoprim (BACTRIM) 400-80 MG  tablet Take 1 tablet by mouth 2 (two) times daily. (Patient not taking: Reported on 08/12/2017) 20 tablet 0   No current facility-administered medications for this visit.      Allergies:   Patient has no known allergies.   Social History:  The patient  reports that she has never smoked. She has never used smokeless tobacco. She reports that she does not drink alcohol or use drugs.   Family History:   family history includes Breast cancer in her maternal aunt.    Review of Systems: ROS   PHYSICAL EXAM: VS:  LMP 11/27/2010  , BMI There is no height or weight on file to calculate BMI. GEN: Well nourished, well developed, in no acute distress  HEENT: normal  Neck: no JVD, carotid bruits, or masses Cardiac: RRR; no murmurs, rubs, or gallops,no edema  Respiratory:  clear to auscultation bilaterally, normal work of breathing GI: soft, nontender, nondistended, + BS MS: no deformity or atrophy  Skin: warm and dry, no rash Neuro:  Strength and sensation are intact Psych: euthymic mood, full affect    Recent Labs: 08/12/2017: ALT 11; BUN 19; Creatinine, Ser 0.67; Potassium 3.9; Sodium 141; TSH 0.54    Lipid Panel Lab Results  Component Value Date   CHOL 128 08/12/2017   HDL 41.80 08/12/2017   LDLCALC 68 06/08/2015   TRIG 207.0 (H) 08/12/2017  Wt Readings from Last 3 Encounters:  08/12/17 137 lb 11.2 oz (62.5 kg)  11/02/16 124 lb (56.2 kg)  09/26/16 127 lb (57.6 kg)       ASSESSMENT AND PLAN:  No diagnosis found.   Disposition:   F/U  6 months  No orders of the defined types were placed in this encounter.    Signed, Dossie Arbourim Adelei Scobey, M.D., Ph.D. 11/24/2017  [facility], [place] [phone]

## 2017-11-25 ENCOUNTER — Ambulatory Visit: Payer: BLUE CROSS/BLUE SHIELD | Admitting: Cardiovascular Disease

## 2017-11-27 ENCOUNTER — Encounter: Payer: Self-pay | Admitting: Cardiovascular Disease

## 2017-11-27 ENCOUNTER — Encounter

## 2017-11-27 ENCOUNTER — Ambulatory Visit (INDEPENDENT_AMBULATORY_CARE_PROVIDER_SITE_OTHER): Payer: BLUE CROSS/BLUE SHIELD | Admitting: Cardiovascular Disease

## 2017-11-27 VITALS — BP 148/82 | HR 80 | Ht 62.0 in | Wt 134.5 lb

## 2017-11-27 DIAGNOSIS — N644 Mastodynia: Secondary | ICD-10-CM | POA: Diagnosis not present

## 2017-11-27 DIAGNOSIS — I10 Essential (primary) hypertension: Secondary | ICD-10-CM

## 2017-11-27 DIAGNOSIS — R0683 Snoring: Secondary | ICD-10-CM

## 2017-11-27 DIAGNOSIS — N6320 Unspecified lump in the left breast, unspecified quadrant: Secondary | ICD-10-CM | POA: Diagnosis not present

## 2017-11-27 NOTE — Patient Instructions (Signed)

## 2017-11-27 NOTE — Progress Notes (Signed)
Cardiology Office Note  Date:  11/27/2017   ID:  Tiburcio Bashlenor M Sandora, DOB 08/02/1970, MRN [id number]  PCP:  Dianne DunAron, Talia M, MD   Chief Complaint  Patient presents with  . other    HTN. Medications verbally reviewed.    HPI:  Ms. Kristin Hunter is a 47 year old woman with past medical history of Essential hypertension Sleep apnea, snoring Prior history of septoplasty Who presents by referral from Dr. Clifton CustardAaron for consultation of her hypertension  She is concerned that her hypertension could be a result of a cardiac problem Weight is up at least 10 pounds over the past year She has been at school, working, take care of 2 teenage boys Currently taking HCTZ 6.25 mg every other day Ideally she would like to come off the medication  Long discussion concerning her home blood pressure numbers She has a list with her today Typically systolic pressure 125 up to 129 Diastolic pressures 85 up to 88 Rarely diastolic pressure greater than 90  She reports weight is down 4 to 5 pounds, changing her diet Also working out at the gym 5 days a week  She does eat out at least 4 days a week Typically every weekend and to weekdays  Otherwise reports that she feels well no shortness of breath or chest discomfort with exertion  Both parents have heart hypertension  EKG personally reviewed by myself on todays visit Shows normal sinus rhythm rate 80 bpm no significant ST or T wave changes from   PMH:   has a past medical history of Headache and Hyperthyroidism.  PSH:    Past Surgical History:  Procedure Laterality Date  . CHOLECYSTECTOMY    . NASAL TURBINATE REDUCTION Bilateral 11/02/2016   Procedure: TURBINATE REDUCTION/SUBMUCOSAL RESECTION;  Surgeon: Linus SalmonsMcQueen, Chapman, MD;  [facility];  Service: ENT;  Laterality: Bilateral;  . PARTIAL HYSTERECTOMY    . SEPTOPLASTY Bilateral 11/02/2016   Procedure: SEPTOPLASTY;  Surgeon: Linus SalmonsMcQueen, Chapman, MD;  [facility];  Service:  ENT;  Laterality: Bilateral;    Current Outpatient Medications  Medication Sig Dispense Refill  . co-enzyme Q-10 30 MG capsule Take 30 mg by mouth 3 (three) times daily.    Marland Kitchen. estradiol (VIVELLE-DOT) 0.025 MG/24HR Place onto the skin.    . hydrochlorothiazide (HYDRODIURIL) 12.5 MG tablet Take 0.5 tablets (6.25 mg total) by mouth every other day. 30 tablet 3  . Multiple Vitamin (MULTI-VITAMINS) TABS Take by mouth.    Marland Kitchen. OVER THE COUNTER MEDICATION daily. BIOTIN ORAL     . oxyCODONE-acetaminophen (ROXICET) 5-325 MG tablet Take 1-2 tablets by mouth every 4 (four) hours as needed. 40 tablet 0  . sulfamethoxazole-trimethoprim (BACTRIM) 400-80 MG tablet Take 1 tablet by mouth 2 (two) times daily. 20 tablet 0   No current facility-administered medications for this visit.      Allergies:   Patient has no known allergies.   Social History:  The patient  reports that she has never smoked. She has never used smokeless tobacco. She reports that she does not drink alcohol or use drugs.   Family History:   family history includes Breast cancer in her maternal aunt.    Review of Systems: Review of Systems  Constitutional: Negative.   Respiratory: Negative.   Cardiovascular: Negative.   Gastrointestinal: Negative.   Musculoskeletal: Negative.   Neurological: Negative.   Psychiatric/Behavioral: Negative.   All other systems reviewed and are negative.    PHYSICAL EXAM: VS:  BP (!) 148/82 (BP Location: Left  Arm, Patient Position: Sitting, Cuff Size: Normal)   Pulse 80   Ht 5\' 2"  (1.575 m)   Wt 134 lb 8 oz (61 kg)   LMP 11/27/2010   BMI 24.60 kg/m  , BMI Body mass index is 24.6 kg/m. GEN: Well nourished, well developed, in no acute distress  HEENT: normal  Neck: no JVD, carotid bruits, or masses Cardiac: RRR; no murmurs, rubs, or gallops,no edema  Respiratory:  clear to auscultation bilaterally, normal work of breathing GI: soft, nontender, nondistended, + BS MS: no deformity or atrophy   Skin: warm and dry, no rash Neuro:  Strength and sensation are intact Psych: euthymic mood, full affect   Recent Labs: 08/12/2017: ALT 11; BUN 19; Creatinine, Ser 0.67; Potassium 3.9; Sodium 141; TSH 0.54    Lipid Panel Lab Results  Component Value Date   CHOL 128 08/12/2017   HDL 41.80 08/12/2017   LDLCALC 68 06/08/2015   TRIG 207.0 (H) 08/12/2017      Wt Readings from Last 3 Encounters:  11/27/17 134 lb 8 oz (61 kg)  08/12/17 137 lb 11.2 oz (62.5 kg)  11/02/16 124 lb (56.2 kg)      ASSESSMENT AND PLAN:  Essential hypertension Long discussion with her,  Blood pressure numbers at home are actually within goal  systolic pressure less than 130s diastolic pressure less than 90 on  home measurements Numbers may be improving with recent weight loss Her plan is to lose additional weight.  She does eat out at least 4 days a week but is exercising on a daily basis, watching her carb intake There is a genetic component as both parents have hypertension We also discussed that as she is aging blood pressure can become more of an issue Recommend she try to minimize her salt intake Taking HCTZ periodically may allow her to eat out I suspect she may not need HCTZ on a daily basis, especially if she continues to lose weight -EKG essentially benign, normal clinical exam  Snoring Recommended she consider a dental appliance She will discuss with her dentist  Disposition:   F/U as needed  Patient was seen in consultation for Dr. Dayton Martes and will be referred back to her office for ongoing care of the issues detailed above   Total encounter time more than 60 minutes  Greater than 50% was spent in counseling and coordination of care with the patient   No orders of the defined types were placed in this encounter.    Signed, Dossie Arbour, M.D., Ph.D. 11/27/2017  [facility] [place], [place] [phone]

## 2017-11-28 ENCOUNTER — Other Ambulatory Visit: Payer: Self-pay | Admitting: Obstetrics and Gynecology

## 2017-11-28 DIAGNOSIS — N644 Mastodynia: Secondary | ICD-10-CM

## 2017-11-29 ENCOUNTER — Other Ambulatory Visit: Payer: Self-pay | Admitting: Obstetrics and Gynecology

## 2017-11-29 DIAGNOSIS — N644 Mastodynia: Secondary | ICD-10-CM

## 2017-12-11 ENCOUNTER — Ambulatory Visit
Admission: RE | Admit: 2017-12-11 | Discharge: 2017-12-11 | Disposition: A | Discharge status: Home / Self Care | Payer: BLUE CROSS/BLUE SHIELD | Source: Ambulatory Visit | Attending: Obstetrics and Gynecology | Admitting: Obstetrics and Gynecology

## 2017-12-11 DIAGNOSIS — N644 Mastodynia: Secondary | ICD-10-CM | POA: Diagnosis not present

## 2017-12-11 DIAGNOSIS — N6489 Other specified disorders of breast: Secondary | ICD-10-CM | POA: Diagnosis not present

## 2017-12-11 DIAGNOSIS — F4322 Adjustment disorder with anxiety: Secondary | ICD-10-CM | POA: Diagnosis not present

## 2017-12-11 DIAGNOSIS — R922 Inconclusive mammogram: Secondary | ICD-10-CM | POA: Diagnosis not present

## 2018-01-21 ENCOUNTER — Encounter

## 2018-01-21 ENCOUNTER — Ambulatory Visit: Payer: BLUE CROSS/BLUE SHIELD | Admitting: Cardiovascular Disease

## 2018-01-29 DIAGNOSIS — F4322 Adjustment disorder with anxiety: Secondary | ICD-10-CM | POA: Diagnosis not present

## 2018-03-19 ENCOUNTER — Other Ambulatory Visit: Payer: Self-pay | Admitting: Obstetrics and Gynecology

## 2018-03-19 DIAGNOSIS — Z01419 Encounter for gynecological examination (general) (routine) without abnormal findings: Secondary | ICD-10-CM | POA: Diagnosis not present

## 2018-03-19 DIAGNOSIS — Z1231 Encounter for screening mammogram for malignant neoplasm of breast: Secondary | ICD-10-CM | POA: Diagnosis not present

## 2018-03-26 DIAGNOSIS — F4322 Adjustment disorder with anxiety: Secondary | ICD-10-CM | POA: Diagnosis not present

## 2018-04-30 ENCOUNTER — Ambulatory Visit
Admission: RE | Admit: 2018-04-30 | Discharge: 2018-04-30 | Disposition: A | Discharge status: Home / Self Care | Payer: BLUE CROSS/BLUE SHIELD | Source: Ambulatory Visit | Attending: Obstetrics and Gynecology | Admitting: Obstetrics and Gynecology

## 2018-04-30 DIAGNOSIS — Z1231 Encounter for screening mammogram for malignant neoplasm of breast: Secondary | ICD-10-CM | POA: Diagnosis not present

## 2018-10-14 ENCOUNTER — Encounter: Payer: Self-pay | Admitting: Family Medicine

## 2018-10-14 ENCOUNTER — Telehealth: Payer: Self-pay

## 2018-10-14 NOTE — Telephone Encounter (Signed)
Copied from [place] [id number]. Topic: General - Other >> Oct 13, 2018  9:59 AM Parke Poisson wrote: Reason for CRM: Pt is requesting note stating that she has hypertension and it would be better for her to do classes at home instead of classroom due to covid. She would like this sent to her email. Deadline is 10/15/18

## 2018-10-14 NOTE — Progress Notes (Signed)
Created note and emailed to patient/thx dmf

## 2018-10-14 NOTE — Telephone Encounter (Signed)
Created note and emailed/pt aware/thx dmf

## 2018-12-07 DIAGNOSIS — R319 Hematuria, unspecified: Secondary | ICD-10-CM | POA: Diagnosis not present

## 2018-12-07 DIAGNOSIS — N390 Urinary tract infection, site not specified: Secondary | ICD-10-CM | POA: Diagnosis not present

## 2018-12-12 DIAGNOSIS — R300 Dysuria: Secondary | ICD-10-CM | POA: Diagnosis not present

## 2018-12-22 ENCOUNTER — Telehealth: Payer: Self-pay

## 2018-12-22 NOTE — Telephone Encounter (Signed)
Questions for Screening COVID-19  Symptom onset: None  Travel or Contacts: None  During this illness, did/does the patient experience any of the following symptoms? Fever >100.97F []   Yes [x]   No []   Unknown Subjective fever (felt feverish) []   Yes [x]   No []   Unknown Chills []   Yes [x]   No []   Unknown Muscle aches (myalgia) []   Yes [x]   No []   Unknown Runny nose (rhinorrhea) []   Yes [x]   No []   Unknown Sore throat []   Yes [x]   No []   Unknown Cough (new onset or worsening of chronic cough) []   Yes [x]   No []   Unknown Shortness of breath (dyspnea) []   Yes [x]   No []   Unknown Nausea or vomiting []   Yes [x]   No []   Unknown Headache []   Yes [x]   No []   Unknown Abdominal pain  []   Yes [x]   No []   Unknown Diarrhea (?3 loose/looser than normal stools/24hr period) []   Yes [x]   No []   Unknown Other, specify:  Patient risk factors: Smoker? []   Current []   Former []   Never If female, currently pregnant? []   Yes []   No  Patient Active Problem List   Diagnosis Date Noted  . HTN (hypertension) 06/27/2016  . Fatigue 06/08/2015  . Snoring 06/08/2015    Plan:  []   High risk for COVID-19 with red flags go to ED (with CP, SOB, weak/lightheaded, or fever > 101.5). Call ahead.  []   High risk for COVID-19 but stable. Inform provider and coordinate time for [facility] visit.   []   No red flags but URI signs or symptoms okay for [facility] visit.

## 2018-12-23 ENCOUNTER — Ambulatory Visit (INDEPENDENT_AMBULATORY_CARE_PROVIDER_SITE_OTHER): Payer: BC Managed Care – PPO | Admitting: Family Medicine

## 2018-12-23 VITALS — BP 134/82 | HR 75 | Temp 98.8°F | Wt 133.6 lb

## 2018-12-23 DIAGNOSIS — I10 Essential (primary) hypertension: Secondary | ICD-10-CM | POA: Diagnosis not present

## 2018-12-23 DIAGNOSIS — Z0000 Encounter for general adult medical examination without abnormal findings: Secondary | ICD-10-CM

## 2018-12-23 DIAGNOSIS — E785 Hyperlipidemia, unspecified: Secondary | ICD-10-CM | POA: Diagnosis not present

## 2018-12-23 DIAGNOSIS — R5383 Other fatigue: Secondary | ICD-10-CM | POA: Diagnosis not present

## 2018-12-23 DIAGNOSIS — E559 Vitamin D deficiency, unspecified: Secondary | ICD-10-CM | POA: Diagnosis not present

## 2018-12-23 LAB — LIPID PANEL
Cholesterol: 125 mg/dL (ref 0–200)
HDL: 48.9 mg/dL (ref >39.00)
LDL Cholesterol: 60 mg/dL (ref 0–99)
NonHDL: 76.52
Total CHOL/HDL Ratio: 3
Triglycerides: 83 mg/dL (ref 0.0–149.0)
VLDL: 16.6 mg/dL (ref 0.0–40.0)

## 2018-12-23 LAB — CBC WITH DIFFERENTIAL/PLATELET
Basophils Absolute: 0.1 10*3/uL (ref 0.0–0.1)
Basophils Relative: 3 % (ref 0.0–3.0)
Eosinophils Absolute: 0.1 10*3/uL (ref 0.0–0.7)
Eosinophils Relative: 2.2 % (ref 0.0–5.0)
HCT: 37.9 % (ref 36.0–46.0)
Hemoglobin: 12.9 g/dL (ref 12.0–15.0)
Lymphocytes Relative: 44.5 % (ref 12.0–46.0)
Lymphs Abs: 1.5 10*3/uL (ref 0.7–4.0)
MCHC: 34.1 g/dL (ref 30.0–36.0)
MCV: 93.5 fl (ref 78.0–100.0)
Monocytes Absolute: 0.3 10*3/uL (ref 0.1–1.0)
Monocytes Relative: 9.6 % (ref 3.0–12.0)
Neutro Abs: 1.4 10*3/uL (ref 1.4–7.7)
Neutrophils Relative %: 40.7 % — ABNORMAL LOW (ref 43.0–77.0)
Platelets: 380 10*3/uL (ref 150.0–400.0)
RBC: 4.06 Mil/uL (ref 3.87–5.11)
RDW: 13.6 % (ref 11.5–15.5)
WBC: 3.4 10*3/uL — ABNORMAL LOW (ref 4.0–10.5)

## 2018-12-23 LAB — COMPREHENSIVE METABOLIC PANEL
ALT: 8 U/L (ref 0–35)
AST: 10 U/L (ref 0–37)
Albumin: 4.3 g/dL (ref 3.5–5.2)
Alkaline Phosphatase: 52 U/L (ref 39–117)
BUN: 11 mg/dL (ref 6–23)
CO2: 29 mEq/L (ref 19–32)
Calcium: 9.2 mg/dL (ref 8.4–10.5)
Chloride: 105 mEq/L (ref 96–112)
Creatinine, Ser: 0.56 mg/dL (ref 0.40–1.20)
GFR: 115.13 mL/min (ref >60.00)
Glucose, Bld: 88 mg/dL (ref 70–99)
Potassium: 3.8 mEq/L (ref 3.5–5.1)
Sodium: 139 mEq/L (ref 135–145)
Total Bilirubin: 0.3 mg/dL (ref 0.2–1.2)
Total Protein: 7.2 g/dL (ref 6.0–8.3)

## 2018-12-23 LAB — TSH: TSH: 0.69 u[IU]/mL (ref 0.35–4.50)

## 2018-12-23 LAB — HEMOGLOBIN A1C: Hgb A1c MFr Bld: 5.5 % (ref 4.6–6.5)

## 2018-12-23 LAB — VITAMIN D 25 HYDROXY (VIT D DEFICIENCY, FRACTURES): VITD: 30.09 ng/mL (ref 30.00–100.00)

## 2018-12-23 MED ORDER — HYDROCHLOROTHIAZIDE 12.5 MG PO TABS: 6.2500 mg | ORAL_TABLET | ORAL | 3 refills | Status: DC

## 2018-12-23 NOTE — Assessment & Plan Note (Signed)
Well controlled with as needed HCTZ 6.25 mg daily.  Orders Placed This Encounter  Procedures  . Comp Met (CMET)  . CBC w/Diff  . Lipid Profile  . TSH  . VITAMIN D 25 Hydroxy (Vit-D Deficiency, Fractures)  . Hemoglobin A1c

## 2018-12-23 NOTE — Assessment & Plan Note (Addendum)
Reviewed preventive care protocols, scheduled due services, and updated immunizations Discussed nutrition, exercise, diet, and healthy lifestyle.  

## 2018-12-23 NOTE — Progress Notes (Signed)
Subjective:   Patient ID: Kristin Hunter, female    DOB: June 28, 1970, 48 y.o.   MRN: [id number]  Kristin Hunter is a pleasant 48 y.o. year old female who presents to clinic today with Annual Exam (Pt is here today for a CPE without PAP. She is due for Flu Vaccine. Mammogram is UTD from 2.2020.)  on 12/23/2018  HPI:  Doing well- taking virtual yoga classes, working from home.  Health Maintenance  Topic Date Due  . HIV Screening  02/12/1985  . PAP SMEAR-Modifier  12/08/2012  . TETANUS/TDAP  08/13/2027  . INFLUENZA VACCINE  Completed   Remote h/o hysterectomy.  Mammogram 04/30/18. Has GYN, Dr. Leafy Ro.  Last seen on 03/19/18.  Note reviewed. Pap smear done at that [place]. She also prescribes Climara patch and Detrol LA 4 mg for her. She stopped taking Detrol- she didn't feel she needed it.  Depression screen [facility] 2/9 12/23/2018 08/12/2017  Decreased Interest 0 0  Down, Depressed, Hopeless 0 0  PHQ - 2 Score 0 0   HTN- cardiologist told her to take HCTZ 6.25 mg only if she has a salty meal.  Never has edema. No HA, blurred vision, CP or SOB.  BP Readings from Last 3 Encounters:  12/23/18 134/82  11/27/17 (!) 148/82  08/12/17 (!) 128/92    Current Outpatient Medications on File Prior to Visit  Medication Sig Dispense Refill  . co-enzyme Q-10 30 MG capsule Take 30 mg by mouth 3 (three) times daily.    Marland Kitchen estradiol (VIVELLE-DOT) 0.025 MG/24HR Place onto the skin.    . hydrochlorothiazide (HYDRODIURIL) 12.5 MG tablet Take 0.5 tablets (6.25 mg total) by mouth every other day. 30 tablet 3  . Multiple Vitamin (MULTI-VITAMINS) TABS Take by mouth.    Marland Kitchen OVER THE COUNTER MEDICATION daily. BIOTIN ORAL      No current facility-administered medications on file prior to visit.     No Known Allergies  Past Medical History:  Diagnosis Date  . Headache   . Hyperthyroidism     Past Surgical History:  Procedure Laterality Date  . CHOLECYSTECTOMY    . NASAL TURBINATE REDUCTION Bilateral  11/02/2016   Procedure: TURBINATE REDUCTION/SUBMUCOSAL RESECTION;  Surgeon: Beverly Gust, MD;  Location: [place];  Service: ENT;  Laterality: Bilateral;  . PARTIAL HYSTERECTOMY    . SEPTOPLASTY Bilateral 11/02/2016   Procedure: SEPTOPLASTY;  Surgeon: Beverly Gust, MD;  Location: [place];  Service: ENT;  Laterality: Bilateral;    Family History  Problem Relation Age of Onset  . Breast cancer Maternal Aunt        late 36's    Social History   Socioeconomic History  . Marital status: Married    Spouse name: Not on file  . Number of children: Not on file  . Years of education: Not on file  . Highest education level: Not on file  Occupational History  . Not on file  Social Needs  . Financial resource strain: Not on file  . Food insecurity    Worry: Not on file    Inability: Not on file  . Transportation needs    Medical: Not on file    Non-medical: Not on file  Tobacco Use  . Smoking status: Never Smoker  . Smokeless tobacco: Never Used  Substance and Sexual Activity  . Alcohol use: No  . Drug use: No  . Sexual activity: Not on file  Lifestyle  . Physical activity    Days per  week: Not on file    Minutes per session: Not on file  . Stress: Not on file  Relationships  . Social Herbalist on phone: Not on file    Gets together: Not on file    Attends religious service: Not on file    Active member of club or organization: Not on file    Attends meetings of clubs or organizations: Not on file    Relationship status: Not on file  . Intimate partner violence    Fear of current or ex partner: Not on file    Emotionally abused: Not on file    Physically abused: Not on file    Forced sexual activity: Not on file  Other Topics Concern  . Not on file  Social History Narrative  . Not on file   The PMH, PSH, Social History, Family History, Medications, and allergies have been reviewed in [facility], and have been updated if relevant.   Review of Systems  Constitutional: Negative.   HENT: Negative.   Eyes: Negative.   Respiratory: Negative.   Cardiovascular: Negative.   Gastrointestinal: Negative.   Endocrine: Negative.   Genitourinary: Negative.   Musculoskeletal: Negative.   Allergic/Immunologic: Negative.   Neurological: Negative.   Hematological: Negative.   Psychiatric/Behavioral: Negative.   All other systems reviewed and are negative.      Objective:    BP 134/82   Pulse 75   Temp 98.8 F (37.1 C) (Oral)   Wt 133 lb 9.6 oz (60.6 kg)   LMP 11/27/2010   SpO2 98%   BMI 24.44 kg/m    Physical Exam   General:  Well-developed,well-nourished,in no acute distress; alert,appropriate and cooperative throughout examination Head:  normocephalic and atraumatic.   Eyes:  vision grossly intact, PERRL Ears:  R ear normal and L ear normal externally, TMs clear bilaterally Nose:  no external deformity.   Mouth:  good dentition.   Neck:  No deformities, masses, or tenderness noted.   Lungs:  Normal respiratory effort, chest expands symmetrically. Lungs are clear to auscultation, no crackles or wheezes. Heart:  Normal rate and regular rhythm. S1 and S2 normal without gallop, murmur, click, rub or other extra sounds. Abdomen:  Bowel sounds positive,abdomen soft and non-tender without masses, organomegaly or hernias noted. Msk:  No deformity or scoliosis noted of thoracic or lumbar spine.   Extremities:  No clubbing, cyanosis, edema, or deformity noted with normal full range of motion of all joints.   Neurologic:  alert & oriented X3 and gait normal.   Skin:  Intact without suspicious lesions or rashes Cervical Nodes:  No lymphadenopathy noted Axillary Nodes:  No palpable lymphadenopathy Psych:  Cognition and judgment appear intact. Alert and cooperative with normal attention span and concentration. No apparent delusions, illusions, hallucinations       Assessment & Plan:   Well woman exam without  gynecological exam - Plan: Comp Met (CMET), CBC w/Diff  Essential hypertension - Plan: Comp Met (CMET), CBC w/Diff, TSH  Vitamin D deficiency - Plan: VITAMIN D 25 Hydroxy (Vit-D Deficiency, Fractures)  Other fatigue - Plan: Comp Met (CMET), CBC w/Diff, TSH  Hyperlipidemia, unspecified hyperlipidemia type - Plan: Comp Met (CMET), CBC w/Diff, Lipid Profile, Hemoglobin A1c No follow-ups on file.

## 2018-12-23 NOTE — Patient Instructions (Signed)
Great to see you. I will call you with your lab results from today and you can view them online.   

## 2019-02-19 ENCOUNTER — Encounter: Payer: Self-pay | Admitting: Family Medicine

## 2019-02-20 DIAGNOSIS — H5213 Myopia, bilateral: Secondary | ICD-10-CM | POA: Diagnosis not present

## 2019-02-20 DIAGNOSIS — G510 Bell's palsy: Secondary | ICD-10-CM | POA: Insufficient documentation

## 2019-02-20 DIAGNOSIS — H524 Presbyopia: Secondary | ICD-10-CM | POA: Diagnosis not present

## 2019-02-20 NOTE — Progress Notes (Signed)
Virtual Visit via Video   Due to the COVID-19 pandemic, this visit was completed with telemedicine (audio/video) technology to reduce patient and provider exposure as well as to preserve personal protective equipment.   I connected with Kristin Hunter by a video enabled telemedicine application and verified that I am speaking with the correct person using two identifiers. Location patient: Home Location provider: [facility], Office Persons participating in the virtual visit: Corrinna M Tyler Hunter, Arnette Norris, MD   I discussed the limitations of evaluation and management by telemedicine and the availability of in person appointments. The patient expressed understanding and agreed to proceed.  Care Team   Patient Care Team: Lucille Passy, MD as PCP - General  Subjective:   HPI:   Pt sent the following message:  "Hi Dr. Deborra Medina, I hope all is well for you. I have a virtual appoinment scheduled with you on Monday, 12/14. I spoke with a nurse at your office today and he suggested that I send you a message related to my health concern to get your advice on whether a virutal appt will suffice or if a face-to-face appt. is needed. For the past week I have had very light tingling to slight numbing in my left jaw (left side of my face). The tingling is not constant, it comes and goes. As of a couple of days ago, I noticed my left eye has a slight delay when I blink. This is not constant, it happens occassionally. I feel fine otherwise but am concerned with what maybe going on. Thanks, Kristin Hunter"  Lab Results  Component Value Date   VITAMINB12 306 09/06/2015     No Weakness on one side of the face. Mild Drooping eyelid buy none at the corner of the mouth. No Excessive tearing in one eye. No Difficulty closing the eyelid- but she feels she feels eyelid is delayed when opening. Some Dry eye. No Drooling. No Dry mouth. No Changes in taste. No Change in facial appearance. No Pain behind one  ear. No Ringing in one or both ears. No Sensitivity to sound in one ear. No Facial twitching but tingling on left side above cheek bone. No Headache. No rash Not painful or itchy  Impaired speech.  Dizziness.  Difficulty eating or drinking.  Review of Systems  Constitutional: Negative for fever and malaise/fatigue.  HENT: Negative for congestion and hearing loss.   Eyes: Negative for blurred vision, discharge and redness.  Respiratory: Negative for cough and shortness of breath.   Cardiovascular: Negative for chest pain, palpitations and leg swelling.  Gastrointestinal: Negative for abdominal pain and heartburn.  Genitourinary: Negative for dysuria.  Musculoskeletal: Negative for falls.  Skin: Negative for rash.  Neurological: Positive for tingling and focal weakness. Negative for loss of consciousness and headaches.  Endo/Heme/Allergies: Does not bruise/bleed easily.  Psychiatric/Behavioral: Negative for depression.  All other systems reviewed and are negative.    Patient Active Problem List   Diagnosis Date Noted  . Paresthesia 02/20/2019  . Well woman exam without gynecological exam 12/23/2018  . HTN (hypertension) 06/27/2016  . Fatigue 06/08/2015  . Snoring 06/08/2015    Social History   Tobacco Use  . Smoking status: Never Smoker  . Smokeless tobacco: Never Used  Substance Use Topics  . Alcohol use: No    Current Outpatient Medications:  .  co-enzyme Q-10 30 MG capsule, Take 30 mg by mouth daily. , Disp: , Rfl:  .  estradiol (VIVELLE-DOT) 0.025 MG/24HR, Place onto the  skin., Disp: , Rfl:  .  hydrochlorothiazide (HYDRODIURIL) 12.5 MG tablet, Take 0.5 tablets (6.25 mg total) by mouth every other day., Disp: 30 tablet, Rfl: 3 .  Multiple Vitamin (MULTI-VITAMINS) TABS, Take by mouth., Disp: , Rfl:  .  OVER THE COUNTER MEDICATION, daily. BIOTIN ORAL , Disp: , Rfl:   No Known Allergies  Objective:  Ht _0  (1.575 m)   LMP 11/27/2010   BMI 24.44 kg/m    VITALS: Per patient if applicable, see vitals. GENERAL: Alert, appears well and in no acute distress. HEENT: Atraumatic, conjunctiva clear, no obvious abnormalities on inspection of external nose and ears. NECK: Normal movements of the head and neck. CARDIOPULMONARY: No increased WOB. Speaking in clear sentences. I:E ratio WNL.  MS: Moves all visible extremities without noticeable abnormality. PSYCH: Pleasant and cooperative, well-groomed. Speech normal rate and rhythm. Affect is appropriate. Insight and judgement are appropriate. Attention is focused, linear, and appropriate.  NEURO: CN grossly intact. , or cyanosis noted on face or hands.  Depression screen [facility] 2/9 12/23/2018 08/12/2017  Decreased Interest 0 0  Down, Depressed, Hopeless 0 0  PHQ - 2 Score 0 0     . COVID-19 Education: The signs and symptoms of COVID-19 were discussed with the patient and how to seek care for testing if needed. The importance of social distancing was discussed today. . Reviewed expectations re: course of current medical issues. . Discussed self-management of symptoms. . Outlined signs and symptoms indicating need for more acute intervention. . Patient verbalized understanding and all questions were answered. Marland Kitchen Health Maintenance issues including appropriate healthy diet, exercise, and smoking avoidance were discussed with patient. . See orders for this visit as documented in the electronic medical record.  Arnette Norris, MD  Records requested if needed. Time spent: 25 minutes, of which >50% was spent in obtaining information about her symptoms, reviewing her previous labs, evaluations, and treatments, counseling her about her condition (please see the discussed topics above), and developing a plan to further investigate it; she had a number of questions which I addressed.   Lab Results  Component Value Date   WBC 3.4 (L) 12/23/2018   HGB 12.9 12/23/2018   HCT 37.9 12/23/2018   PLT 380.0 12/23/2018    GLUCOSE 88 12/23/2018   CHOL 125 12/23/2018   TRIG 83.0 12/23/2018   HDL 48.90 12/23/2018   LDLDIRECT 63.0 08/12/2017   LDLCALC 60 12/23/2018   ALT 8 12/23/2018   AST 10 12/23/2018   NA 139 12/23/2018   K 3.8 12/23/2018   CL 105 12/23/2018   CREATININE 0.56 12/23/2018   BUN 11 12/23/2018   CO2 29 12/23/2018   TSH 0.69 12/23/2018   HGBA1C 5.5 12/23/2018    Lab Results  Component Value Date   TSH 0.69 12/23/2018   Lab Results  Component Value Date   WBC 3.4 (L) 12/23/2018   HGB 12.9 12/23/2018   HCT 37.9 12/23/2018   MCV 93.5 12/23/2018   PLT 380.0 12/23/2018   Lab Results  Component Value Date   NA 139 12/23/2018   K 3.8 12/23/2018   CO2 29 12/23/2018   GLUCOSE 88 12/23/2018   BUN 11 12/23/2018   CREATININE 0.56 12/23/2018   BILITOT 0.3 12/23/2018   ALKPHOS 52 12/23/2018   AST 10 12/23/2018   ALT 8 12/23/2018   PROT 7.2 12/23/2018   ALBUMIN 4.3 12/23/2018   CALCIUM 9.2 12/23/2018   GFR 115.13 12/23/2018   Lab Results  Component Value Date  CHOL 125 12/23/2018   Lab Results  Component Value Date   HDL 48.90 12/23/2018   Lab Results  Component Value Date   LDLCALC 60 12/23/2018   Lab Results  Component Value Date   TRIG 83.0 12/23/2018   Lab Results  Component Value Date   CHOLHDL 3 12/23/2018   Lab Results  Component Value Date   HGBA1C 5.5 12/23/2018       Assessment & Plan:   Problem List Items Addressed This Visit      Active Problems   Paresthesia    ?bells palsy. Please protect the eye at night with an eye patch and paper tape. Use Natural Tears to keep eye moist during day. Refer to Optho.   Brush your teeth 3 times a day after meals. Perform the manual exercises for the right facial muscles as many times a day as possible.  CBC, Fasting glucose, lyme titer, rheumatoid factor, HIV, ANA, ESR, ACE, VDRL  Prednisone 60 mg every morning with food, Valtrex 1000 mg three times daily x 1 week.      Relevant Orders   B12 and  Folate Panel   TSH   CBC   Comprehensive metabolic panel   CBC with Differential/Platelet   B. burgdorfi antibodies   Rheumatoid Factor   HIV antibody (with reflex)   Antinuclear Antib (ANA)   Sedimentation rate   Angiotensin converting enzyme   VDRL, Serum    Other Visit Diagnoses    Left-sided Bell's palsy    -  Primary   Relevant Orders   Ambulatory referral to Ophthalmology      I am having Jazzalynn M. Bachar maintain her Multi-Vitamins, OVER THE COUNTER MEDICATION, estradiol, co-enzyme Q-10, and hydrochlorothiazide.  No orders of the defined types were placed in this encounter.    Arnette Norris, MD

## 2019-02-20 NOTE — Assessment & Plan Note (Addendum)
Very mild case- she seems to be able to close her eye fully, no facial droop, normal sensation.  Has already seen her eye doctor. Use Natural Tears to keep eye moist during day.   Brush your teeth 3 times a day after meals. Perform the manual exercises for the right facial muscles as many times a day as possible.  CBC, Fasting glucose, lyme titer, rheumatoid factor, HIV, ANA, ESR, ACE, VDRL  Prednisone 60 mg every morning with food, Valtrex 1000 mg three times daily x 1 week.

## 2019-02-21 NOTE — Patient Instructions (Signed)
Bell Palsy, Adult  Bell palsy is a short-term inability to move muscles in part of the face. The inability to move (paralysis) results from inflammation or compression of the facial nerve, which travels along the skull and under the ear to the side of the face (7th cranial nerve). This nerve is responsible for facial movements that include blinking, closing the eyes, smiling, and frowning. What are the causes? The exact cause of this condition is not known. It may be caused by an infection from a virus, such as the chickenpox (herpes zoster), Epstein-Barr, or mumps virus. What increases the risk? You are more likely to develop this condition if:  You are pregnant.  You have diabetes.  You have had a recent infection in your nose, throat, or airways (upper respiratory infection).  You have a weakened body defense system (immune system).  You have had a facial injury, such as a fracture.  You have a family history of Bell palsy. What are the signs or symptoms? Symptoms of this condition include:  Weakness on one side of the face.  Drooping eyelid and corner of the mouth.  Excessive tearing in one eye.  Difficulty closing the eyelid.  Dry eye.  Drooling.  Dry mouth.  Changes in taste.  Change in facial appearance.  Pain behind one ear.  Ringing in one or both ears.  Sensitivity to sound in one ear.  Facial twitching.  Headache.  Impaired speech.  Dizziness.  Difficulty eating or drinking. Most of the time, only one side of the face is affected. Rarely, Bell palsy affects the whole face. How is this diagnosed? This condition is diagnosed based on:  Your symptoms.  Your medical history.  A physical exam. You may also have to see health care providers who specialize in disorders of the nerves (neurologist) or diseases and conditions of the eye (ophthalmologist). You may have tests, such as:  A test to check for nerve damage (electromyogram).  Imaging  studies, such as CT or MRI scans.  Blood tests. How is this treated? This condition affects every person differently. Sometimes symptoms go away without treatment within a couple weeks. If treatment is needed, it varies from person to person. The goal of treatment is to reduce inflammation and protect the eye from damage. Treatment for Bell palsy may include:  Medicines, such as: ? Steroids to reduce swelling and inflammation. ? Antiviral drugs. ? Pain relievers, including aspirin, acetaminophen, or ibuprofen.  Eye drops or ointment to keep your eye moist.  Eye protection, if you cannot close your eye.  Exercises or massage to regain muscle strength and function (physical therapy). Follow these instructions at home:   Take over-the-counter and prescription medicines only as told by your health care provider.  If your eye is affected: ? Keep your eye moist with eye drops or ointment as told by your health care provider. ? Follow instructions for eye care and protection as told by your health care provider.  Do any physical therapy exercises as told by your health care provider.  Keep all follow-up visits as told by your health care provider. This is important. Contact a health care provider if:  You have a fever.  Your symptoms do not get better within 2-3 weeks, or your symptoms get worse.  Your eye is red, irritated, or painful.  You have new symptoms. Get help right away if:  You have weakness or numbness in a part of your body other than your face.  You have   trouble swallowing.  You develop neck pain or stiffness.  You develop dizziness or shortness of breath. Summary  Bell palsy is a short-term inability to move muscles in part of the face. The inability to move (paralysis) results from inflammation or compression of the facial nerve.  This condition affects every person differently. Sometimes symptoms go away without treatment within a couple weeks.  If  treatment is needed, it varies from person to person. The goal of treatment is to reduce inflammation and protect the eye from damage.  Contact your health care provider if your symptoms do not get better within 2-3 weeks, or your symptoms get worse. This information is not intended to replace advice given to you by your health care provider. Make sure you discuss any questions you have with your health care provider. Document Released: 02/26/2005 Document Revised: 02/08/2017 Document Reviewed: 05/01/2016 Elsevier Patient Education  2020 [employer] Inc[employer]  

## 2019-02-23 ENCOUNTER — Other Ambulatory Visit: Payer: Self-pay

## 2019-02-23 ENCOUNTER — Encounter: Payer: Self-pay | Admitting: Family Medicine

## 2019-02-23 ENCOUNTER — Ambulatory Visit (INDEPENDENT_AMBULATORY_CARE_PROVIDER_SITE_OTHER): Payer: BC Managed Care – PPO | Admitting: Family Medicine

## 2019-02-23 ENCOUNTER — Other Ambulatory Visit: Payer: BC Managed Care – PPO

## 2019-02-23 VITALS — Ht 62.0 in

## 2019-02-23 DIAGNOSIS — R202 Paresthesia of skin: Secondary | ICD-10-CM

## 2019-02-23 DIAGNOSIS — G510 Bell's palsy: Secondary | ICD-10-CM

## 2019-02-23 MED ORDER — VALACYCLOVIR HCL 1 G PO TABS: 1000.0000 mg | ORAL_TABLET | Freq: Three times a day (TID) | ORAL | 0 refills | Status: AC

## 2019-02-23 MED ORDER — PREDNISONE 20 MG PO TABS: 60.0000 mg | ORAL_TABLET | Freq: Every day | ORAL | 0 refills | Status: AC

## 2019-02-23 NOTE — Addendum Note (Signed)
Addended by: Lucille Passy on: 02/23/2019 01:49 PM   Modules accepted: Orders

## 2019-02-23 NOTE — Progress Notes (Signed)
Questions for Screening COVID-19  Symptom onset:   Travel or Contacts: NO  During this illness, did/does the patient experience any of the following symptoms? Fever >100.76F []   Yes [x]   No []   Unknown Subjective fever (felt feverish) []   Yes [x]   No []   Unknown Chills []   Yes [x]   No []   Unknown Muscle aches (myalgia) []   Yes [x]   No []   Unknown Runny nose (rhinorrhea) []   Yes [x]   No []   Unknown Sore throat []   Yes []   No [x]   Unknown Cough (new onset or worsening of chronic cough) []   Yes [x]   No []   Unknown Shortness of breath (dyspnea) []   Yes [x]   No []   Unknown Nausea or vomiting []   Yes [x]   No []   Unknown Headache []   Yes [x]   No []   Unknown Abdominal pain  []   Yes [x]   No []   Unknown Diarrhea (?3 loose/looser than normal stools/24hr period) []   Yes [x]   No []   Unknown Other, specify:  Patient risk factors: Smoker? []   Current []   Former [x]   Never If female, currently pregnant? []   Yes [x]   No  Patient Active Problem List   Diagnosis Date Noted  . Left-sided Bell's palsy 02/20/2019  . Well woman exam without gynecological exam 12/23/2018  . HTN (hypertension) 06/27/2016  . Fatigue 06/08/2015  . Snoring 06/08/2015    Plan:  []   High risk for COVID-19 with red flags go to ED (with CP, SOB, weak/lightheaded, or fever > 101.5). Call ahead.  []   High risk for COVID-19 but stable. Inform provider and coordinate time for [facility] visit.   [x]   No red flags but URI signs or symptoms okay for [facility] visit.

## 2019-02-27 ENCOUNTER — Other Ambulatory Visit: Payer: BC Managed Care – PPO

## 2019-04-17 ENCOUNTER — Other Ambulatory Visit: Payer: Self-pay | Admitting: Obstetrics and Gynecology

## 2019-04-17 DIAGNOSIS — R61 Generalized hyperhidrosis: Secondary | ICD-10-CM | POA: Diagnosis not present

## 2019-04-17 DIAGNOSIS — Z01419 Encounter for gynecological examination (general) (routine) without abnormal findings: Secondary | ICD-10-CM | POA: Diagnosis not present

## 2019-04-17 DIAGNOSIS — Z79890 Hormone replacement therapy (postmenopausal): Secondary | ICD-10-CM | POA: Diagnosis not present

## 2019-04-17 DIAGNOSIS — Z1231 Encounter for screening mammogram for malignant neoplasm of breast: Secondary | ICD-10-CM

## 2019-04-22 ENCOUNTER — Ambulatory Visit: Payer: BC Managed Care – PPO | Admitting: Family Medicine

## 2019-04-22 ENCOUNTER — Other Ambulatory Visit: Payer: Self-pay

## 2019-04-22 ENCOUNTER — Encounter: Payer: Self-pay | Admitting: Family Medicine

## 2019-04-22 VITALS — BP 154/102 | HR 80 | Temp 98.1°F | Ht 62.0 in | Wt 137.0 lb

## 2019-04-22 DIAGNOSIS — I10 Essential (primary) hypertension: Secondary | ICD-10-CM | POA: Diagnosis not present

## 2019-04-22 DIAGNOSIS — R12 Heartburn: Secondary | ICD-10-CM | POA: Diagnosis not present

## 2019-04-22 MED ORDER — LISINOPRIL 5 MG PO TABS: 5.0000 mg | ORAL_TABLET | Freq: Every day | ORAL | 3 refills | Status: DC

## 2019-04-22 NOTE — Assessment & Plan Note (Signed)
Suspect epigastric fullness is heartburn related. Life style changes first. Omeprazole if no improvement. If not improving will consider h pylori testing and or GI referral

## 2019-04-22 NOTE — Progress Notes (Signed)
Subjective:     Kristin Hunter is a 49 y.o. female presenting for Transfer of Care (from Dr Deborra Medina) and Bloated (heartburn. Off and on. this last episode started about 5 days ago.)     HPI   #Bloated - on an off - has been 5 days since it is worse - not sure the trigger - heart burn and burping symptoms - feeling of fullness - did try a gas -x  W/ improvement - will flare up and not sure what it is - no diarrhea or constipation - has not tried heartburn medication - seems like with time more severe - sleeping is more difficult - tried tums w/o improvement - symptoms x 1-2 years  #HTN - had been doing diet and exercise - really does not want to be on medication - saw a cardiologist - took her BP for a period of time  - does not take her medication - does not like taking medication daily - worried that the medication will cause more harm than good - 127/86 at GYN - Home monitoring: 130-135/90s - taking estrogen replacement - was initially worried about cancer concerns and discussed   Review of Systems   Social History   Tobacco Use  Smoking Status Never Smoker  Smokeless Tobacco Never Used        Objective:    BP Readings from Last 3 Encounters:  04/22/19 (!) 154/102  12/23/18 134/82  11/27/17 (!) 148/82   Wt Readings from Last 3 Encounters:  04/22/19 137 lb (62.1 kg)  12/23/18 133 lb 9.6 oz (60.6 kg)  11/27/17 134 lb 8 oz (61 kg)    BP (!) 154/102   Pulse 80   Temp 98.1 F (36.7 C)   Ht 5\' 2"  (1.575 m)   Wt 137 lb (62.1 kg)   LMP 11/27/2010   BMI 25.06 kg/m    Physical Exam Constitutional:      General: She is not in acute distress.    Appearance: She is well-developed. She is not diaphoretic.  HENT:     Right Ear: External ear normal.     Left Ear: External ear normal.     Nose: Nose normal.  Eyes:     Conjunctiva/sclera: Conjunctivae normal.  Cardiovascular:     Rate and Rhythm: Normal rate and regular rhythm.     Heart sounds: No  murmur.  Pulmonary:     Effort: Pulmonary effort is normal. No respiratory distress.     Breath sounds: Normal breath sounds. No wheezing.  Abdominal:     General: Abdomen is flat. Bowel sounds are normal. There is no distension.     Palpations: Abdomen is soft.     Tenderness: There is abdominal tenderness (epigastric, RUQ).  Musculoskeletal:     Cervical back: Neck supple.  Skin:    General: Skin is warm and dry.     Capillary Refill: Capillary refill takes less than 2 seconds.  Neurological:     Mental Status: She is alert. Mental status is at baseline.  Psychiatric:        Mood and Affect: Mood normal.        Behavior: Behavior normal.           Assessment & Plan:   Problem List Items Addressed This Visit      Cardiovascular and Mediastinum   HTN (hypertension) - Primary    Long discussion regarding HTN and given family hx and otherwise healthy life style would benefit from  BP medication daily to reduce risk of MI/Stroke/kidney disease. Pt will start lisinopril - DBP high and no improvement from HCTZ. She is on a lower dose of estrogen, but does plan to continue for a few more years. Did discuss that this could be causing higher BP. She gets lower numbers at home and had a lower BP at her GYN. Home monitoring. Return in 4 weeks for labs and BP      Relevant Medications   lisinopril (ZESTRIL) 5 MG tablet     Other   Heartburn    Suspect epigastric fullness is heartburn related. Life style changes first. Omeprazole if no improvement. If not improving will consider h pylori testing and or GI referral          Return in about 4 weeks (around 05/20/2019).  Lynnda Child, MD

## 2019-04-22 NOTE — Assessment & Plan Note (Signed)
Long discussion regarding HTN and given family hx and otherwise healthy life style would benefit from BP medication daily to reduce risk of MI/Stroke/kidney disease. Pt will start lisinopril - DBP high and no improvement from HCTZ. She is on a lower dose of estrogen, but does plan to continue for a few more years. Did discuss that this could be causing higher BP. She gets lower numbers at home and had a lower BP at her GYN. Home monitoring. Return in 4 weeks for labs and BP

## 2019-04-22 NOTE — Patient Instructions (Signed)
Your blood pressure high.   High blood pressure increases your risk for heart attack and stroke.    Please check your blood pressure 2-4 times a week.   To check your blood pressure 1) Sit in a quiet and relaxed place for 5 minutes 2) Make sure your feet are flat on the ground 3) Consider checking first thing in the morning   Normal blood pressure is less than 140/90 Ideally you blood pressure should be around 120/80  Other ways you can reduce your blood pressure:  1) Regular exercise -- Try to get 150 minutes (30 minutes, 5 days a week) of moderate to vigorous aerobic excercise -- Examples: brisk walking (2.5 miles per hour), water aerobics, dancing, gardening, tennis, biking slower than 10 miles per hour 2) DASH Diet - low fat meats, more fresh fruits and vegetables, whole grains, low salt     If below not working - can try Omeprazole daily x 2 weeks, if improving take for 6-8 weeks. Return if symptoms not better    Heartburn Heartburn is a type of pain or discomfort that can happen in the throat or chest. It is often described as a burning pain. It may also cause a bad, acid-like taste in the mouth. Heartburn may feel worse when you lie down or bend over, and it is often worse at night. Heartburn may be caused by stomach contents that move back up into the esophagus (reflux). Follow these instructions at home: Eating and drinking   Avoid certain foods and drinks as told by your health care provider. This may include: ? Coffee and tea (with or without caffeine). ? Drinks that contain alcohol. ? Energy drinks and sports drinks. ? Carbonated drinks or sodas. ? Chocolate and cocoa. ? Peppermint and mint flavorings. ? Garlic and onions. ? Horseradish. ? Spicy and acidic foods, including peppers, chili powder, curry powder, vinegar, hot sauces, and barbecue sauce. ? Citrus fruit juices and citrus fruits, such as oranges, lemons, and limes. ? Tomato-based foods, such as red  sauce, chili, salsa, and pizza with red sauce. ? Fried and fatty foods, such as donuts, french fries, potato chips, and high-fat dressings. ? High-fat meats, such as hot dogs and fatty cuts of red and white meats, such as rib eye steak, sausage, ham, and bacon. ? High-fat dairy items, such as whole milk, butter, and cream cheese.  Eat small, frequent meals instead of large meals.  Avoid drinking large amounts of liquid with your meals.  Avoid eating meals during the 2-3 hours before bedtime.  Avoid lying down right after you eat.  Do not exercise right after you eat. Lifestyle      If you are overweight, reduce your weight to an amount that is healthy for you. Ask your health care provider for guidance about a safe weight loss goal.  Do not use any products that contain nicotine or tobacco, such as cigarettes, e-cigarettes, and chewing tobacco. These can make your symptoms worse. If you need help quitting, ask your health care provider.  Wear loose-fitting clothing. Do not wear anything tight around your waist that causes pressure on your abdomen.  Raise (elevate) the head of your bed about 6 inches (15 cm) when you sleep.  Try to reduce your stress, such as with yoga or meditation. If you need help reducing stress, ask your health care provider. General instructions  Pay attention to any changes in your symptoms.  Take over-the-counter and prescription medicines only as told by your  health care provider. ? Do not take aspirin, ibuprofen, or other NSAIDs unless your health care provider told you to do so. ? Stop medicines only as told by your health care provider. If you stop taking some medicines too quickly, your symptoms may get worse.  Keep all follow-up visits as told by your health care provider. This is important. Contact a health care provider if:  You have new symptoms.  You have unexplained weight loss.  You have difficulty swallowing, or it hurts to  swallow.  You have wheezing or a persistent cough.  Your symptoms do not improve with treatment.  You have frequent heartburn for more than 2 weeks. Get help right away if:  You have pain in your arms, neck, jaw, teeth, or back.  You feel sweaty, dizzy, or light-headed.  You have chest pain or shortness of breath.  You vomit and your vomit looks like blood or coffee grounds.  Your stool is bloody or black. These symptoms may represent a serious problem that is an emergency. Do not wait to see if the symptoms will go away. Get medical help right away. Call your local emergency services (911 in the [place] Do not drive yourself to the hospital. Summary  Heartburn is a type of pain or discomfort that can happen in the throat or chest. It is often described as a burning pain. It may also cause a bad, acid-like taste in the mouth.  Avoid certain foods and drinks as told by your health care provider.  Take over-the-counter and prescription medicines only as told by your health care provider. Do not take aspirin, ibuprofen, or other NSAIDs unless your health care provider told you to do so.  Contact a health care provider if your symptoms do not improve or they get worse. This information is not intended to replace advice given to you by your health care provider. Make sure you discuss any questions you have with your health care provider. Document Revised: 07/29/2017 Document Reviewed: 07/29/2017 Elsevier Patient Education  2020 [employer].

## 2019-05-06 ENCOUNTER — Encounter: Payer: Self-pay | Admitting: Family Medicine

## 2019-05-06 DIAGNOSIS — I10 Essential (primary) hypertension: Secondary | ICD-10-CM

## 2019-05-07 MED ORDER — LISINOPRIL 10 MG PO TABS: 10.0000 mg | ORAL_TABLET | Freq: Every day | ORAL | 0 refills | Status: DC

## 2019-05-14 ENCOUNTER — Ambulatory Visit
Admission: RE | Admit: 2019-05-14 | Discharge: 2019-05-14 | Disposition: A | Discharge status: Home / Self Care | Payer: BC Managed Care – PPO | Source: Ambulatory Visit | Attending: Obstetrics and Gynecology | Admitting: Obstetrics and Gynecology

## 2019-05-14 DIAGNOSIS — Z1231 Encounter for screening mammogram for malignant neoplasm of breast: Secondary | ICD-10-CM | POA: Insufficient documentation

## 2019-05-20 ENCOUNTER — Encounter: Payer: Self-pay | Admitting: Family Medicine

## 2019-05-20 ENCOUNTER — Other Ambulatory Visit: Payer: Self-pay

## 2019-05-20 ENCOUNTER — Ambulatory Visit: Payer: BC Managed Care – PPO | Admitting: Family Medicine

## 2019-05-20 VITALS — BP 148/82 | HR 62 | Temp 98.2°F | Ht 62.0 in | Wt 135.1 lb

## 2019-05-20 DIAGNOSIS — I10 Essential (primary) hypertension: Secondary | ICD-10-CM | POA: Diagnosis not present

## 2019-05-20 LAB — COMPREHENSIVE METABOLIC PANEL
ALT: 11 U/L (ref 0–35)
AST: 13 U/L (ref 0–37)
Albumin: 4.3 g/dL (ref 3.5–5.2)
Alkaline Phosphatase: 47 U/L (ref 39–117)
BUN: 19 mg/dL (ref 6–23)
CO2: 30 mEq/L (ref 19–32)
Calcium: 9.4 mg/dL (ref 8.4–10.5)
Chloride: 103 mEq/L (ref 96–112)
Creatinine, Ser: 0.72 mg/dL (ref 0.40–1.20)
GFR: 104.06 mL/min (ref >60.00)
Glucose, Bld: 88 mg/dL (ref 70–99)
Potassium: 4.1 mEq/L (ref 3.5–5.1)
Sodium: 138 mEq/L (ref 135–145)
Total Bilirubin: 0.4 mg/dL (ref 0.2–1.2)
Total Protein: 7.8 g/dL (ref 6.0–8.3)

## 2019-05-20 LAB — CBC
HCT: 41.1 % (ref 36.0–46.0)
Hemoglobin: 13.9 g/dL (ref 12.0–15.0)
MCHC: 33.8 g/dL (ref 30.0–36.0)
MCV: 94.9 fl (ref 78.0–100.0)
Platelets: 372 10*3/uL (ref 150.0–400.0)
RBC: 4.34 Mil/uL (ref 3.87–5.11)
RDW: 13.6 % (ref 11.5–15.5)
WBC: 4.2 10*3/uL (ref 4.0–10.5)

## 2019-05-20 LAB — TSH: TSH: 0.7 u[IU]/mL (ref 0.35–4.50)

## 2019-05-20 MED ORDER — LISINOPRIL 20 MG PO TABS: 20.0000 mg | ORAL_TABLET | Freq: Every day | ORAL | 1 refills | Status: DC

## 2019-05-20 NOTE — Progress Notes (Signed)
   Subjective:     Kristin Hunter is a 49 y.o. female presenting for Hypertension (follow up)     HPI  #HTN - has been feeling fine - has been keeping track of home  - since increased to 10 mg - 149/96, 149/92, 124/83 on the right - seems to be consistently a little lower on the right side - no cp, sob - medication at nighttime  Hx of snoring, previous work-up negative  Review of Systems  04/22/2019: Clinic - HTN - starting lisinopril 5 mg, cont HCTZ 05/06/2019: MyChart - BP still elevated. Increase lisinopril to 10 mg  Social History   Tobacco Use  Smoking Status Never Smoker  Smokeless Tobacco Never Used        Objective:    BP Readings from Last 3 Encounters:  05/20/19 (!) 148/82  04/22/19 (!) 154/102  12/23/18 134/82   Wt Readings from Last 3 Encounters:  05/20/19 135 lb 2 oz (61.3 kg)  04/22/19 137 lb (62.1 kg)  12/23/18 133 lb 9.6 oz (60.6 kg)    BP (!) 148/82   Pulse 62   Temp 98.2 F (36.8 C)   Ht 5\' 2"  (1.575 m)   Wt 135 lb 2 oz (61.3 kg)   LMP 11/27/2010   BMI 24.71 kg/m    Physical Exam Constitutional:      General: She is not in acute distress.    Appearance: She is well-developed. She is not diaphoretic.  HENT:     Right Ear: External ear normal.     Left Ear: External ear normal.     Nose: Nose normal.  Eyes:     Conjunctiva/sclera: Conjunctivae normal.  Cardiovascular:     Rate and Rhythm: Normal rate and regular rhythm.     Heart sounds: No murmur.  Pulmonary:     Effort: Pulmonary effort is normal. No respiratory distress.     Breath sounds: Normal breath sounds. No wheezing.  Musculoskeletal:     Cervical back: Neck supple.  Skin:    General: Skin is warm and dry.     Capillary Refill: Capillary refill takes less than 2 seconds.  Neurological:     Mental Status: She is alert. Mental status is at baseline.  Psychiatric:        Mood and Affect: Mood normal.        Behavior: Behavior normal.           Assessment  & Plan:   Problem List Items Addressed This Visit      Cardiovascular and Mediastinum   HTN (hypertension) - Primary    BP improved, but not at goal. Will increase lisinopril to 20 mg. Home monitoring. If elevated will plan to add additional medication (likely HCTZ). Continue healthy life style. Repeat blood work given significant and persistent change since October though reviewed prior EKG 2019 reassuring and prior OSA work-up normal.       Relevant Medications   lisinopril (ZESTRIL) 20 MG tablet   Other Relevant Orders   TSH   Comprehensive metabolic panel   CBC       Return in about 2 months (around 07/20/2019).  09/19/2019, MD

## 2019-05-20 NOTE — Assessment & Plan Note (Signed)
BP improved, but not at goal. Will increase lisinopril to 20 mg. Home monitoring. If elevated will plan to add additional medication (likely HCTZ). Continue healthy life style. Repeat blood work given significant and persistent change since October though reviewed prior EKG 2019 reassuring and prior OSA work-up normal.

## 2019-05-20 NOTE — Patient Instructions (Signed)
#  High Blood pressure - keep checking your blood pressure occasionally - increase lisinopril to 20 mg - labs via MyChart tomorrow - mychart in 2-4 weeks with your numbers at home - if not improving follow-up in 2 months

## 2019-05-21 ENCOUNTER — Encounter: Payer: Self-pay | Admitting: Family Medicine

## 2019-05-31 ENCOUNTER — Other Ambulatory Visit: Payer: Self-pay | Admitting: Family Medicine

## 2019-05-31 DIAGNOSIS — I10 Essential (primary) hypertension: Secondary | ICD-10-CM

## 2019-06-05 DIAGNOSIS — L668 Other cicatricial alopecia: Secondary | ICD-10-CM | POA: Diagnosis not present

## 2019-06-05 DIAGNOSIS — L218 Other seborrheic dermatitis: Secondary | ICD-10-CM | POA: Diagnosis not present

## 2019-07-09 IMAGING — MG MM DIGITAL SCREENING BILAT W/ TOMO W/ CAD
9 of 17 series · 9 of 33 positions shown · non-contrast
Comparison: Previous exam(s).

CLINICAL DATA: Screening.

EXAM:
DIGITAL SCREENING BILATERAL MAMMOGRAM WITH TOMO AND CAD

[R MLO (1 of 2)]
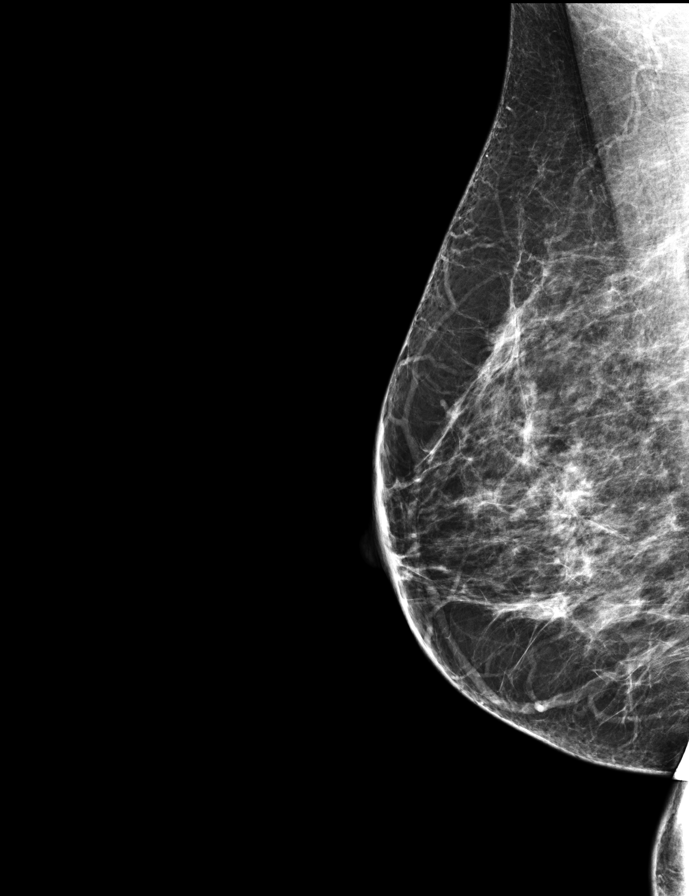

[R MLO (2 of 2)]
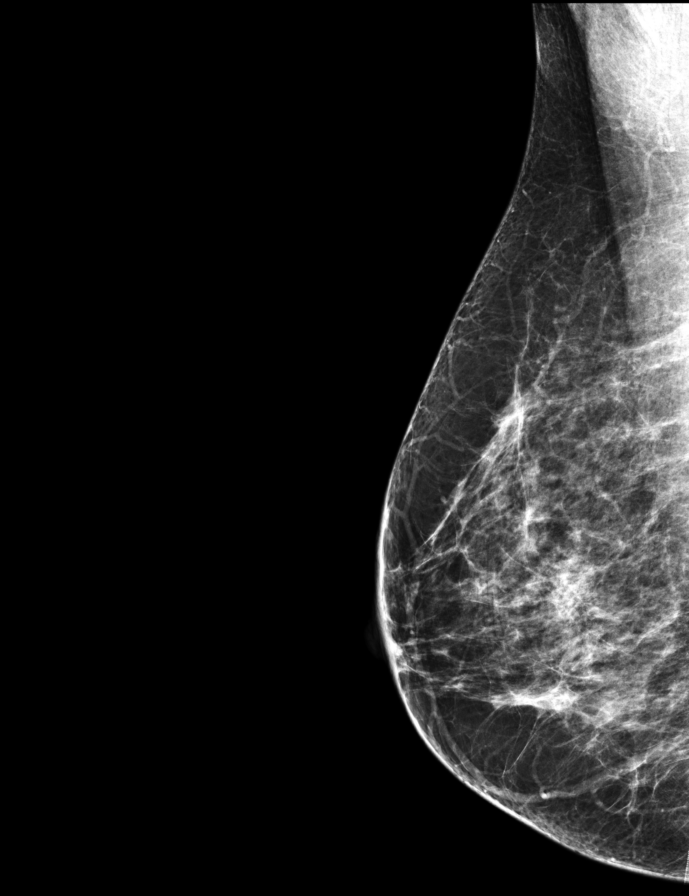

[R CC]
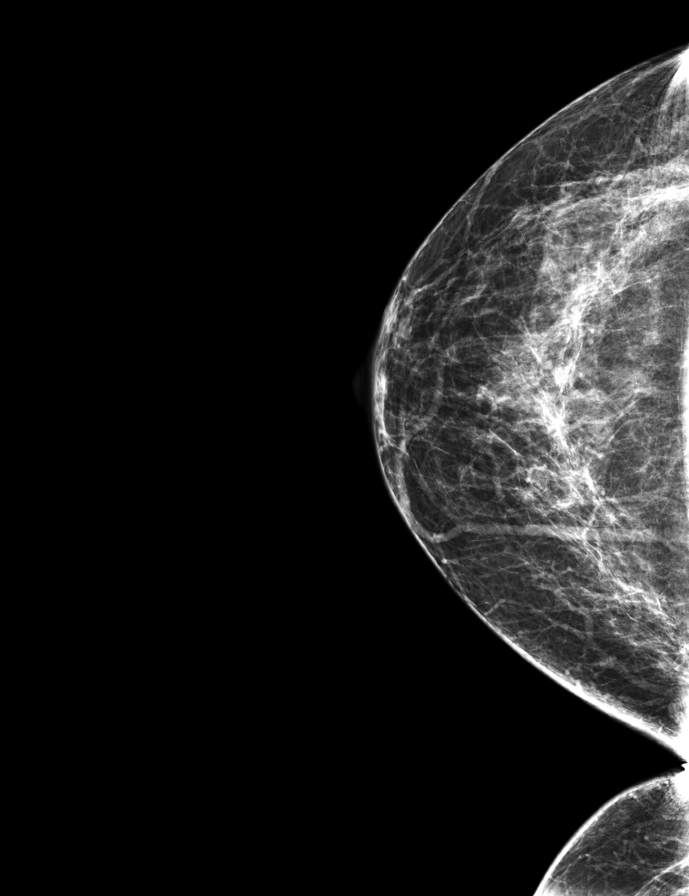

[R MLO synth-2D]
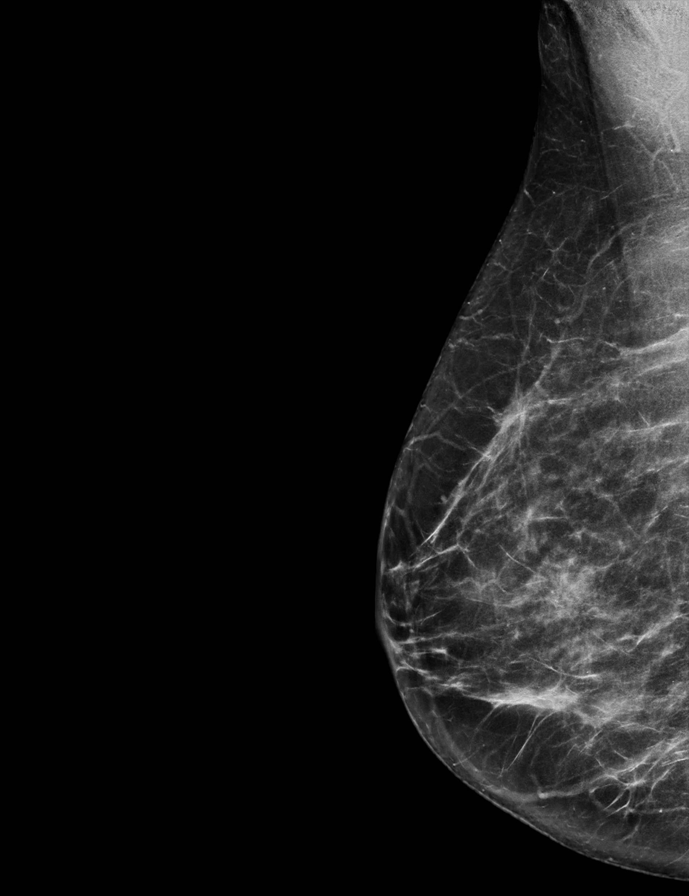

[L MLO synth-2D]
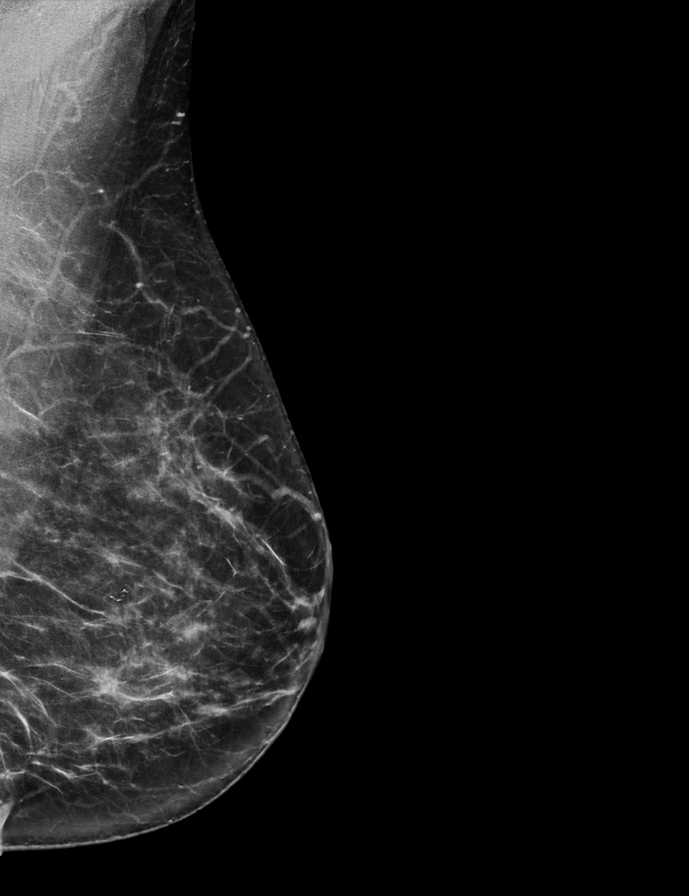

[R CC synth-2D]
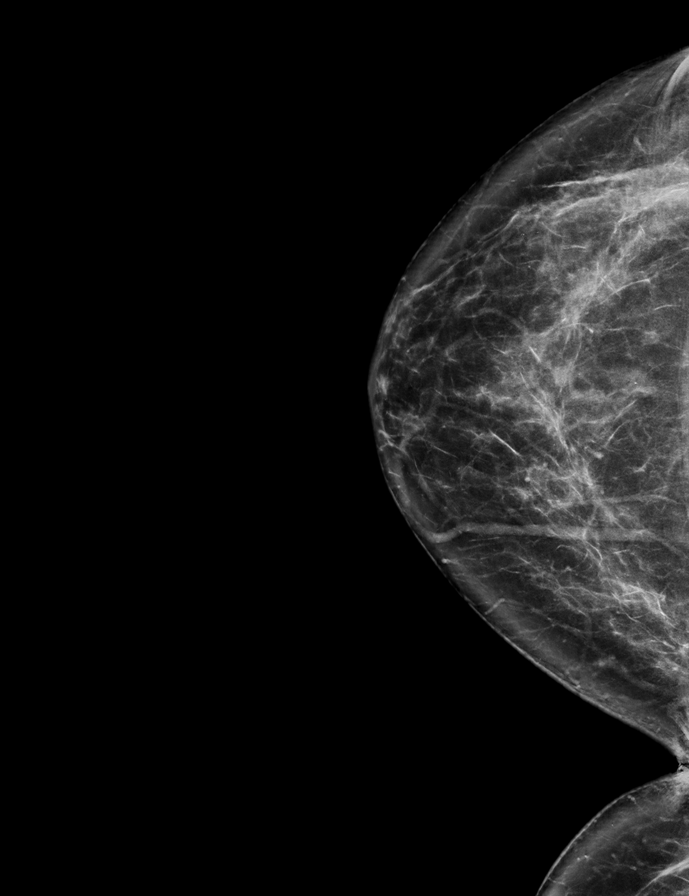

[L CC]
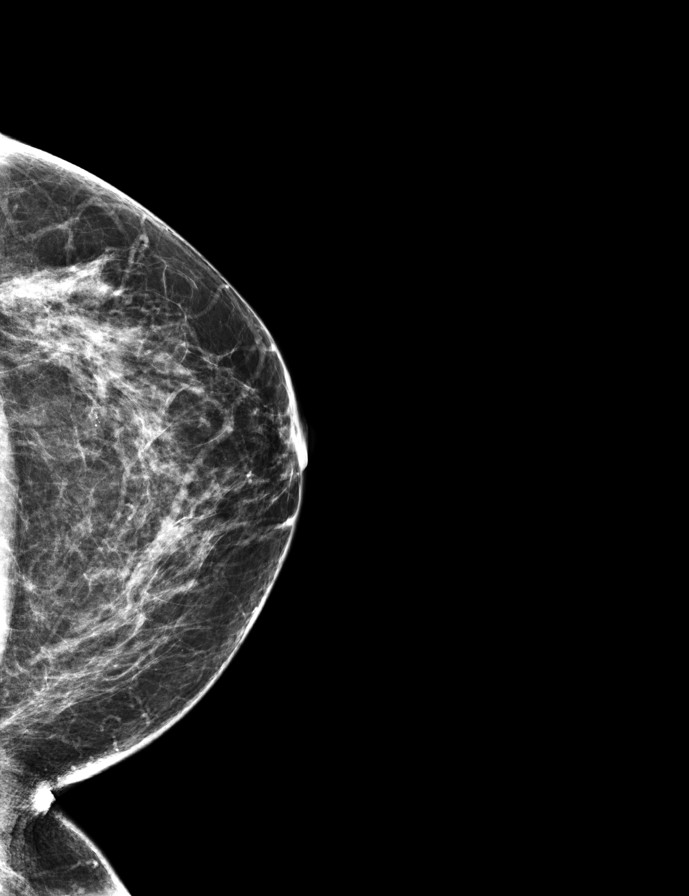

[L MLO]
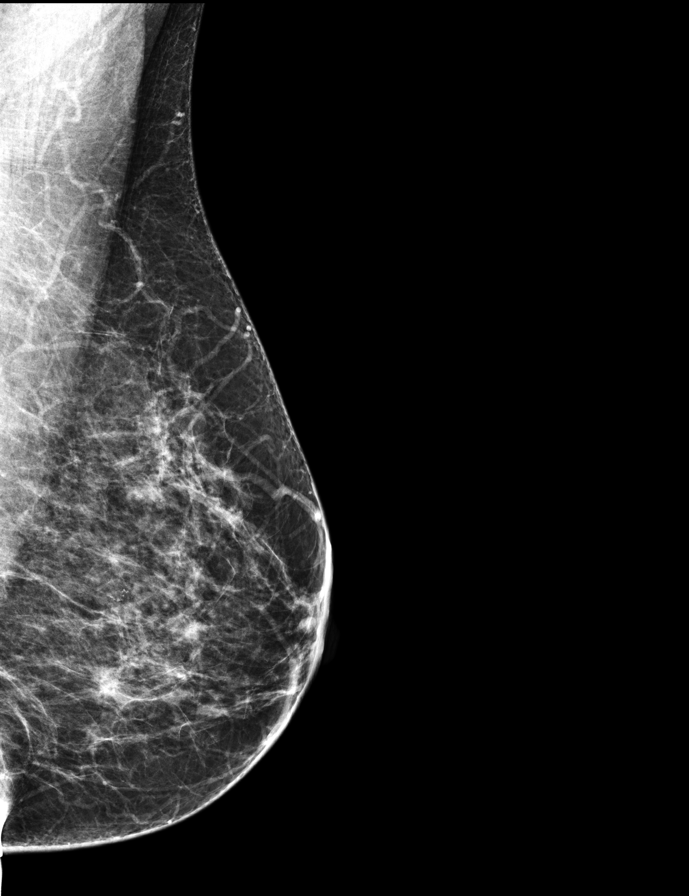

[L CC synth-2D]
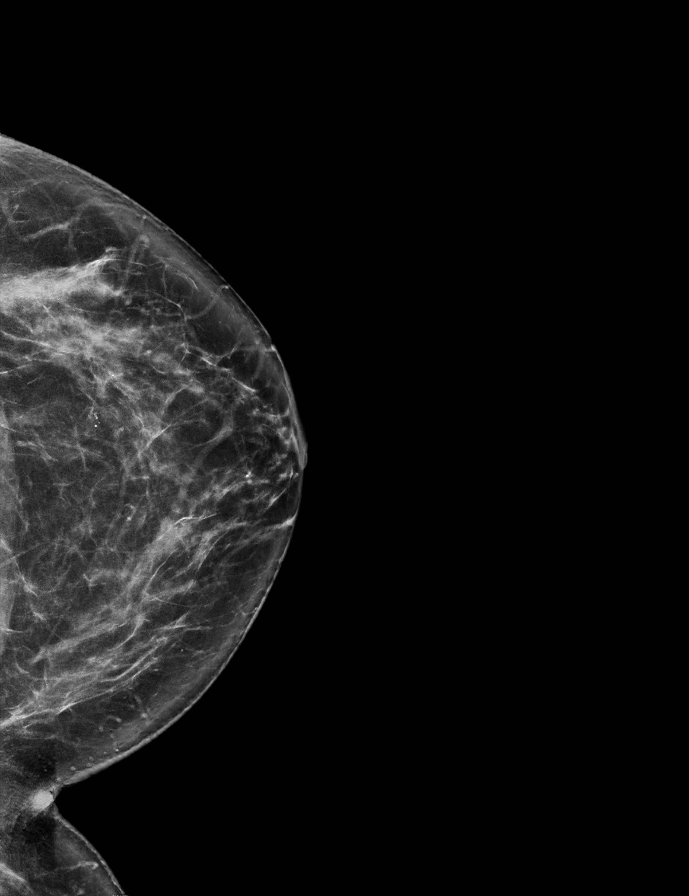

[9 of 33 positions shown; findings below may reference images not displayed]

ACR Breast Density Category c: The breast tissue is heterogeneously
dense, which may obscure small masses.
FINDINGS: In the left breast, a possible mass warrants further evaluation. In
the right breast, no findings suspicious for malignancy.

Images were processed with CAD.
IMPRESSION: Further evaluation is suggested for possible mass in the left
breast.

RECOMMENDATION:
Diagnostic mammogram and possibly ultrasound of the left breast.
(Code:IJ-[id number])

The patient will be contacted regarding the findings, and additional
imaging will be scheduled.

BI-RADS CATEGORY  0: Incomplete. Need additional imaging evaluation
and/or prior mammograms for comparison.

## 2019-10-01 DIAGNOSIS — Z20822 Contact with and (suspected) exposure to covid-19: Secondary | ICD-10-CM | POA: Diagnosis not present

## 2019-11-02 ENCOUNTER — Ambulatory Visit: Payer: BC Managed Care – PPO | Admitting: Family Medicine

## 2019-11-02 ENCOUNTER — Other Ambulatory Visit: Payer: Self-pay

## 2019-11-02 ENCOUNTER — Encounter: Payer: Self-pay | Admitting: Family Medicine

## 2019-11-02 DIAGNOSIS — I10 Essential (primary) hypertension: Secondary | ICD-10-CM | POA: Diagnosis not present

## 2019-11-02 MED ORDER — LISINOPRIL 20 MG PO TABS: 20.0000 mg | ORAL_TABLET | Freq: Every day | ORAL | 3 refills | Status: DC

## 2019-11-02 NOTE — Assessment & Plan Note (Signed)
BP at goal. Continue lisinopril. Return in 6 months

## 2019-11-02 NOTE — Progress Notes (Signed)
   Subjective:     Natale FANY CAVANAUGH is a 49 y.o. female presenting for Follow-up (hypertension )     HPI  #HTN - has not been checking at home - taking the medication  - no cp, sob, ha - has been exercising 3-4 days a week - cardio and weight lifting - diet is OK  Review of Systems   Social History   Tobacco Use  Smoking Status Never Smoker  Smokeless Tobacco Never Used        Objective:    BP Readings from Last 3 Encounters:  11/02/19 118/80  05/20/19 (!) 148/82  04/22/19 (!) 154/102   Wt Readings from Last 3 Encounters:  11/02/19 135 lb 4 oz (61.3 kg)  05/20/19 135 lb 2 oz (61.3 kg)  04/22/19 137 lb (62.1 kg)    BP 118/80   Pulse 72   Temp 98.2 F (36.8 C) (Temporal)   Ht 5\' 2"  (1.575 m)   Wt 135 lb 4 oz (61.3 kg)   LMP 11/27/2010   SpO2 98%   BMI 24.74 kg/m    Physical Exam Constitutional:      General: She is not in acute distress.    Appearance: She is well-developed. She is not diaphoretic.  HENT:     Right Ear: External ear normal.     Left Ear: External ear normal.  Eyes:     Conjunctiva/sclera: Conjunctivae normal.  Cardiovascular:     Rate and Rhythm: Normal rate and regular rhythm.     Heart sounds: No murmur heard.   Pulmonary:     Effort: Pulmonary effort is normal. No respiratory distress.     Breath sounds: Normal breath sounds. No wheezing.  Musculoskeletal:     Cervical back: Neck supple.  Skin:    General: Skin is warm and dry.     Capillary Refill: Capillary refill takes less than 2 seconds.  Neurological:     Mental Status: She is alert. Mental status is at baseline.  Psychiatric:        Mood and Affect: Mood normal.        Behavior: Behavior normal.           Assessment & Plan:   Problem List Items Addressed This Visit      Cardiovascular and Mediastinum   HTN (hypertension)    BP at goal. Continue lisinopril. Return in 6 months      Relevant Medications   lisinopril (ZESTRIL) 20 MG tablet        Return in about 6 months (around 05/04/2020).  05/06/2020, MD  This visit occurred during the SARS-CoV-2 public health emergency.  Safety protocols were in place, including screening questions prior to the visit, additional usage of staff PPE, and extensive cleaning of exam room while observing appropriate contact time as indicated for disinfecting solutions.

## 2019-11-02 NOTE — Patient Instructions (Addendum)
Continue the lisinopril 20 mg daily   Consider checking blood pressure at home if consistently low could consider decreasing dose or even stopping and just watching blood pressure

## 2019-11-13 ENCOUNTER — Encounter: Payer: Self-pay | Admitting: Family Medicine

## 2019-11-29 ENCOUNTER — Other Ambulatory Visit: Payer: Self-pay | Admitting: Family Medicine

## 2019-11-29 DIAGNOSIS — I10 Essential (primary) hypertension: Secondary | ICD-10-CM

## 2020-01-05 DIAGNOSIS — L218 Other seborrheic dermatitis: Secondary | ICD-10-CM | POA: Diagnosis not present

## 2020-01-05 DIAGNOSIS — L659 Nonscarring hair loss, unspecified: Secondary | ICD-10-CM | POA: Diagnosis not present

## 2020-01-05 DIAGNOSIS — L668 Other cicatricial alopecia: Secondary | ICD-10-CM | POA: Diagnosis not present

## 2020-01-05 DIAGNOSIS — L669 Cicatricial alopecia, unspecified: Secondary | ICD-10-CM | POA: Diagnosis not present

## 2020-01-15 DIAGNOSIS — L668 Other cicatricial alopecia: Secondary | ICD-10-CM | POA: Diagnosis not present

## 2020-01-15 DIAGNOSIS — L648 Other androgenic alopecia: Secondary | ICD-10-CM | POA: Diagnosis not present

## 2020-03-17 ENCOUNTER — Telehealth: Payer: Self-pay | Admitting: Family Medicine

## 2020-03-17 NOTE — Telephone Encounter (Signed)
Patient was talking to someone "health Nurse" and she kept getting disconnected.NO note in system. Please call the patient back. EM

## 2020-03-18 NOTE — Telephone Encounter (Signed)
[facility] Day - Client TELEPHONE ADVICE RECORD [facility] Patient Name: Kristin Hunter Gender: Female DOB: 1971-03-09 Age: 49 Y 1 M 2 D Return Phone Number: [phone] (Primary) Address: City/State/Zip: [place] [address] Client [facility] Day - Client Client Site [facility] - Day Physician Gweneth Dimitri- MD Contact Type Call Who Is Calling Patient / Member / Family / Caregiver Call Type Triage / Clinical Caller Name Denisha Relationship To Patient Other Return Phone Number [phone] (Primary) Chief Complaint Abdominal Pain Reason for Call Symptomatic / Request for Health Information Initial Comment Caller states pt has abdomen pain, sweats, and nausea. GOTO Facility Not Listed UC nearby Translation No Nurse Assessment Nurse: Graciella Freer, RN, Lana Date/Time (Eastern Time): 03/17/2020 5:17:53 PM Confirm and document reason for call. If symptomatic, describe symptoms. ---Caller states has abdomen pain, sweats, and nausea. Started about 3 days ago. This morning was very painful. LBM today. No fever Does the patient have any new or worsening symptoms? ---Yes Will a triage be completed? ---Yes Related visit to physician within the last 2 weeks? ---No Does the PT have any chronic conditions? (i.e. diabetes, asthma, this includes High risk factors for pregnancy, etc.) ---Yes List chronic conditions. ---HTN Is the patient pregnant or possibly pregnant? (Ask all females between the ages of 56-55) ---No Is this a behavioral health or substance abuse call? ---No Guidelines Guideline Title Affirmed Question Affirmed Notes Nurse Date/Time (Eastern Time) Abdominal Pain - Female [1] MILD-MODERATE pain AND [2] constant AND [3] present > 2 hours Graciella Freer, RN, Lana 03/17/2020 5:20:02 PM Disp. Time Lamount Cohen Time) Disposition Final User PLEASE NOTE: All timestamps contained within this report are represented as [place]  Standard Time. CONFIDENTIALTY NOTICE: This fax transmission is intended only for the addressee. It contains information that is legally privileged, confidential or otherwise protected from use or disclosure. If you are not the intended recipient, you are strictly prohibited from reviewing, disclosing, copying using or disseminating any of this information or taking any action in reliance on or regarding this information. If you have received this fax in error, please notify us immediately by telephone so that we can arrange for its return to [place]. Phone: [phone], Toll-Free: [phone], Fax: [phone] Page: 2 of 2 Call Id: [id number] 03/17/2020 5:28:12 PM See HCP within 4 Hours (or PCP triage) Yes Graciella Freer, RN, Vickey Huger Caller Disagree/Comply Comply Caller Understands Yes PreDisposition Call Doctor Care Advice Given Per Guideline SEE HCP (OR PCP TRIAGE) WITHIN 4 HOURS: * IF OFFICE WILL BE CLOSED AND NO PCP (PRIMARY CARE PROVIDER) SECOND-LEVEL TRIAGE: You need to be seen within the next 3 or 4 hours. A nearby Urgent Care Center [facility]) is often a good source of care. Another choice is to go to the ED. Go sooner if you become worse. NOTHING BY MOUTH: * Do not eat or drink anything for now. CALL BACK IF: * You become worse CARE ADVICE given per Abdominal Pain, Female (Adult) guideline. Comments User: Philipp Ovens, RN Date/Time Lamount Cohen Time): 03/17/2020 5:20:50 PM pain now 3/10 Referrals GO TO FACILITY OTHER - SPECIFY

## 2020-03-18 NOTE — Telephone Encounter (Signed)
I do not see where this note was routed to anyone.

## 2020-03-18 NOTE — Telephone Encounter (Signed)
I spoke with pt; pt did not go to UC yesterday. Pt began to fill better and rested at home. Pt has been taking pepto; pt has not gotten up yet this morning and body is not as achy as yesterday. Pt said much less dull pain in mid lower abd that comes and goes. No available appts at [facility] and if pt condition changes or worsens pt will go to UC. fYI to Dr Selena Batten who is not in office and Dr Ermalene Searing who is in office.

## 2020-03-18 NOTE — Telephone Encounter (Signed)
Noted  

## 2020-04-12 DIAGNOSIS — I10 Essential (primary) hypertension: Secondary | ICD-10-CM | POA: Diagnosis not present

## 2020-04-21 DIAGNOSIS — Z1211 Encounter for screening for malignant neoplasm of colon: Secondary | ICD-10-CM | POA: Diagnosis not present

## 2020-04-21 DIAGNOSIS — Z1231 Encounter for screening mammogram for malignant neoplasm of breast: Secondary | ICD-10-CM | POA: Diagnosis not present

## 2020-04-21 DIAGNOSIS — Z01419 Encounter for gynecological examination (general) (routine) without abnormal findings: Secondary | ICD-10-CM | POA: Diagnosis not present

## 2020-05-12 DIAGNOSIS — I10 Essential (primary) hypertension: Secondary | ICD-10-CM | POA: Diagnosis not present

## 2020-06-11 DIAGNOSIS — I10 Essential (primary) hypertension: Secondary | ICD-10-CM | POA: Diagnosis not present

## 2020-06-13 ENCOUNTER — Other Ambulatory Visit: Payer: Self-pay | Admitting: Family Medicine

## 2020-06-13 DIAGNOSIS — I10 Essential (primary) hypertension: Secondary | ICD-10-CM

## 2020-06-14 NOTE — Telephone Encounter (Signed)
Patient has been without it for 5 days now and called in concerned that she hasn't had it. Patient is wanting [place] to call it in today for her.  Lisinopril - Please send a 3 week supply to [facility]- [address] in [place]. [facility] that it would be delivered on the 13th  Please advise. EM

## 2020-07-06 ENCOUNTER — Other Ambulatory Visit: Payer: Self-pay | Admitting: Family Medicine

## 2020-07-06 DIAGNOSIS — I10 Essential (primary) hypertension: Secondary | ICD-10-CM

## 2020-07-11 DIAGNOSIS — I10 Essential (primary) hypertension: Secondary | ICD-10-CM | POA: Diagnosis not present

## 2020-08-06 ENCOUNTER — Other Ambulatory Visit: Payer: Self-pay | Admitting: Family Medicine

## 2020-08-06 DIAGNOSIS — I10 Essential (primary) hypertension: Secondary | ICD-10-CM

## 2020-08-10 DIAGNOSIS — I10 Essential (primary) hypertension: Secondary | ICD-10-CM | POA: Diagnosis not present

## 2020-08-16 ENCOUNTER — Other Ambulatory Visit: Payer: Self-pay | Admitting: Obstetrics and Gynecology

## 2020-08-16 ENCOUNTER — Other Ambulatory Visit: Payer: Self-pay | Admitting: Family Medicine

## 2020-08-22 ENCOUNTER — Other Ambulatory Visit: Payer: Self-pay | Admitting: Obstetrics and Gynecology

## 2020-08-22 DIAGNOSIS — N644 Mastodynia: Secondary | ICD-10-CM | POA: Diagnosis not present

## 2020-08-22 DIAGNOSIS — Z1231 Encounter for screening mammogram for malignant neoplasm of breast: Secondary | ICD-10-CM | POA: Diagnosis not present

## 2020-08-26 ENCOUNTER — Ambulatory Visit
Admission: RE | Admit: 2020-08-26 | Discharge: 2020-08-26 | Disposition: A | Discharge status: Home / Self Care | Payer: BC Managed Care – PPO | Source: Ambulatory Visit | Attending: Obstetrics and Gynecology | Admitting: Obstetrics and Gynecology

## 2020-08-26 ENCOUNTER — Other Ambulatory Visit: Payer: Self-pay

## 2020-08-26 DIAGNOSIS — R922 Inconclusive mammogram: Secondary | ICD-10-CM | POA: Diagnosis not present

## 2020-08-26 DIAGNOSIS — N644 Mastodynia: Secondary | ICD-10-CM | POA: Insufficient documentation

## 2020-09-03 ENCOUNTER — Other Ambulatory Visit: Payer: Self-pay | Admitting: Family Medicine

## 2020-09-03 DIAGNOSIS — I10 Essential (primary) hypertension: Secondary | ICD-10-CM

## 2020-09-06 NOTE — Telephone Encounter (Signed)
Pt needs a follow-up appt for hypertension. Please and thank you.

## 2020-09-06 NOTE — Telephone Encounter (Signed)
Spoke with scheduled follow up appointment  

## 2020-09-09 DIAGNOSIS — I10 Essential (primary) hypertension: Secondary | ICD-10-CM | POA: Diagnosis not present

## 2020-09-13 ENCOUNTER — Other Ambulatory Visit: Payer: Self-pay

## 2020-09-13 ENCOUNTER — Ambulatory Visit: Payer: BC Managed Care – PPO | Admitting: Family Medicine

## 2020-09-13 VITALS — BP 138/78 | HR 82 | Temp 98.2°F | Ht 62.0 in | Wt 137.5 lb

## 2020-09-13 DIAGNOSIS — Z1159 Encounter for screening for other viral diseases: Secondary | ICD-10-CM | POA: Diagnosis not present

## 2020-09-13 DIAGNOSIS — I10 Essential (primary) hypertension: Secondary | ICD-10-CM

## 2020-09-13 DIAGNOSIS — Z13220 Encounter for screening for lipoid disorders: Secondary | ICD-10-CM | POA: Diagnosis not present

## 2020-09-13 DIAGNOSIS — Z114 Encounter for screening for human immunodeficiency virus [HIV]: Secondary | ICD-10-CM | POA: Diagnosis not present

## 2020-09-13 MED ORDER — LISINOPRIL 20 MG PO TABS: 20.0000 mg | ORAL_TABLET | Freq: Every day | ORAL | 3 refills | Status: DC

## 2020-09-13 NOTE — Assessment & Plan Note (Signed)
Well controlled. Cont lisinopril 20 mg.

## 2020-09-13 NOTE — Progress Notes (Signed)
Subjective:     Kristin Hunter is a 50 y.o. female presenting for Follow-up (HT) and Medication Refill     Medication Refill   #HTN - getting higher readings at home  - when she goes to ob/gyn is always getting lower readings  - no cp, sob, dizziness   Has an appointment with Dr. Norma Fredrickson  Review of Systems  11/02/2019: Clinic - HTN - BP at goal on lisinopril 20 mg  Social History   Tobacco Use  Smoking Status Never  Smokeless Tobacco Never        Objective:    BP Readings from Last 3 Encounters:  09/13/20 138/78  11/02/19 118/80  05/20/19 (!) 148/82   Wt Readings from Last 3 Encounters:  09/13/20 137 lb 8 oz (62.4 kg)  11/02/19 135 lb 4 oz (61.3 kg)  05/20/19 135 lb 2 oz (61.3 kg)    BP 138/78   Pulse 82   Temp 98.2 F (36.8 C) (Temporal)   Ht 5\' 2"  (1.575 m)   Wt 137 lb 8 oz (62.4 kg)   LMP 11/27/2010   SpO2 98%   BMI 25.15 kg/m    Physical Exam Constitutional:      General: She is not in acute distress.    Appearance: She is well-developed. She is not diaphoretic.  HENT:     Right Ear: External ear normal.     Left Ear: External ear normal.     Nose: Nose normal.  Eyes:     Conjunctiva/sclera: Conjunctivae normal.  Cardiovascular:     Rate and Rhythm: Normal rate and regular rhythm.     Heart sounds: No murmur heard. Pulmonary:     Effort: Pulmonary effort is normal. No respiratory distress.     Breath sounds: Normal breath sounds.  Musculoskeletal:     Cervical back: Neck supple.  Skin:    General: Skin is warm and dry.     Capillary Refill: Capillary refill takes less than 2 seconds.  Neurological:     Mental Status: She is alert. Mental status is at baseline.  Psychiatric:        Mood and Affect: Mood normal.        Behavior: Behavior normal.          Assessment & Plan:   Problem List Items Addressed This Visit       Cardiovascular and Mediastinum   HTN (hypertension) - Primary    Well controlled. Cont lisinopril 20  mg.        Relevant Medications   lisinopril (ZESTRIL) 20 MG tablet   Other Visit Diagnoses     Essential hypertension       Relevant Medications   lisinopril (ZESTRIL) 20 MG tablet   Other Relevant Orders   Comprehensive metabolic panel   Need for hepatitis C screening test       Relevant Orders   Hepatitis C antibody   Encounter for screening for HIV       Relevant Orders   HIV Antibody (routine testing w rflx)   Screening for hyperlipidemia       Relevant Orders   Lipid panel        Return in about 1 year (around 09/13/2021) for annual .  11/14/2021, MD  This visit occurred during the SARS-CoV-2 public health emergency.  Safety protocols were in place, including screening questions prior to the visit, additional usage of staff PPE, and extensive cleaning of exam room while observing appropriate  contact time as indicated for disinfecting solutions.

## 2020-09-13 NOTE — Patient Instructions (Addendum)
#  Shingles - may cause flu-like symptoms - would recommend scheduling when having time off the next the day just in case   If still getting higher numbers at home - call to schedule nurse visit to compare home cuff  Please check your blood pressure 2-4 times a week.   To check your blood pressure 1) Sit in a quiet and relaxed place for 5 minutes 2) Make sure your feet are flat on the ground 3) Consider checking first thing in the morning   Normal blood pressure is less than 140/90 Ideally you blood pressure should be around 120/80

## 2020-09-14 LAB — COMPREHENSIVE METABOLIC PANEL
ALT: 9 U/L (ref 0–35)
AST: 11 U/L (ref 0–37)
Albumin: 4.3 g/dL (ref 3.5–5.2)
Alkaline Phosphatase: 58 U/L (ref 39–117)
BUN: 15 mg/dL (ref 6–23)
CO2: 27 mEq/L (ref 19–32)
Calcium: 9.4 mg/dL (ref 8.4–10.5)
Chloride: 104 mEq/L (ref 96–112)
Creatinine, Ser: 0.72 mg/dL (ref 0.40–1.20)
GFR: 97.38 mL/min (ref >60.00)
Glucose, Bld: 113 mg/dL — ABNORMAL HIGH (ref 70–99)
Potassium: 3.6 mEq/L (ref 3.5–5.1)
Sodium: 138 mEq/L (ref 135–145)
Total Bilirubin: 0.2 mg/dL (ref 0.2–1.2)
Total Protein: 7.2 g/dL (ref 6.0–8.3)

## 2020-09-14 LAB — LIPID PANEL
Cholesterol: 144 mg/dL (ref 0–200)
HDL: 45 mg/dL (ref >39.00)
LDL Cholesterol: 63 mg/dL (ref 0–99)
NonHDL: 99.18
Total CHOL/HDL Ratio: 3
Triglycerides: 180 mg/dL — ABNORMAL HIGH (ref 0.0–149.0)
VLDL: 36 mg/dL (ref 0.0–40.0)

## 2020-09-14 LAB — HEPATITIS C ANTIBODY
Hepatitis C Ab: NONREACTIVE
SIGNAL TO CUT-OFF: 0.01 (ref <1.00)

## 2020-09-14 LAB — HIV ANTIBODY (ROUTINE TESTING W REFLEX): HIV 1&2 Ab, 4th Generation: NONREACTIVE

## 2020-10-10 DIAGNOSIS — I10 Essential (primary) hypertension: Secondary | ICD-10-CM | POA: Diagnosis not present

## 2020-12-08 ENCOUNTER — Telehealth: Payer: Self-pay

## 2020-12-08 NOTE — Telephone Encounter (Signed)
Noted, appreciate Dr. Alphonsus Sias seeing pt

## 2020-12-08 NOTE — Telephone Encounter (Signed)
[facility] Day - Client TELEPHONE ADVICE RECORD [facility] Patient Name: Kristin Hunter ITH Gender: Female DOB: 1970/06/13 Age: 50 Y 9 M 25 D Return Phone Number: [phone] (Primary) Address: City/ State/ Zip: [place] [address] Client [facility] Day - Client Client Site [facility] - Day Physician Gweneth Dimitri- MD Contact Type Call Who Is Calling Patient / Member / Family / Caregiver Call Type Triage / Clinical Relationship To Patient Self Return Phone Number [phone] (Primary) Chief Complaint Abdominal Pain Reason for Call Symptomatic / Request for Health Information Initial Comment Caller states the office wanted her to get traiged. She is having left side pain going into her abd and left hip. She wants to now if she can wait until Monday to be seen. It is causing difficulty walking. Translation No Nurse Assessment Nurse: Henri Medal, RN, Amy Date/Time (Eastern Time): 12/08/2020 3:21:11 PM Confirm and document reason for call. If symptomatic, describe symptoms. ---Caller states she is having left sided abdominal pain going into her left hip. It is causing difficulty walking. It started 10 days ago. It is pretty constant & feels like muscle strain. The pain is internal in the abdominal area, but is more external in the hip area. She rates it as a 0/10 with sitting & 6/10 with walking. It is worse when she sits for awhile & then gets up. It is real stiff. No fever. Does the patient have any new or worsening symptoms? ---Yes Will a triage be completed? ---Yes Related visit to physician within the last 2 weeks? ---No Does the PT have any chronic conditions? (i.e. diabetes, asthma, this includes High risk factors for pregnancy, etc.) ---Yes List chronic conditions. ---HTN Is the patient pregnant or possibly pregnant? (Ask all females between the ages of 60-55) ---No Is this a behavioral  health or substance abuse call? ---No PLEASE NOTE: All timestamps contained within this report are represented as [place] Standard Time. CONFIDENTIALTY NOTICE: This fax transmission is intended only for the addressee. It contains information that is legally privileged, confidential or otherwise protected from use or disclosure. If you are not the intended recipient, you are strictly prohibited from reviewing, disclosing, copying using or disseminating any of this information or taking any action in reliance on or regarding this information. If you have received this fax in error, please notify us immediately by telephone so that we can arrange for its return to [place]. Phone: [phone], Toll-Free: [phone], Fax: [phone] Page: 2 of 2 Call Id: [id number] Guidelines Guideline Title Affirmed Question Affirmed Notes Nurse Date/Time Lamount Cohen Time) Abdominal Pain - Female [1] MODERATE pain (e.g., interferes with normal activities) AND [2] pain comes and goes (cramps) AND [3] present > 24 hours (Exception: pain with Vomiting or Diarrhea - see that Guideline) Lovelace, RN, Amy 12/08/2020 3:24:32 PM Disp. Time Lamount Cohen Time) Disposition Final User 12/08/2020 3:27:22 PM See PCP within 24 Hours Yes Lovelace, RN, Amy Caller Disagree/Comply Comply Caller Understands Yes PreDisposition InappropriateToAsk Care Advice Given Per Guideline SEE PCP WITHIN 24 HOURS: * IF OFFICE WILL BE OPEN: You need to be examined within the next 24 hours. Call your doctor (or NP/PA) when the office opens and make an appointment. DIET: * Drink adequate fluids. Eat a bland diet. * Avoid greasy or fatty foods. CALL BACK IF: * Severe pain lasts over 1 hour * Constant pain lasts over 2 hours * You become worse CARE ADVICE given per Abdominal Pain, Female (Adult) guideline. Comments User: Amy,  Lovelace, RN Date/Time (Eastern Time): 12/08/2020 3:32:03 PM Attempted to warm transfer caller to the office to change her  appt. from Monday to tomorrow, but she hung up before being transferred. Referrals REFERRED TO PCP OFFICE

## 2020-12-08 NOTE — Telephone Encounter (Signed)
Pt said has lt sided abd pain that is right at lt hip and down pelvic area; no radiation down leg. Pt has not had injury but does exercise a lot and does yoga so not sure if pulled something or not. Pt said now the pain is an achy  pain. Pt said she does have pain in lt hip until gets to going and then she can walk OK. Pt scheduled appt on 12/09/20 at 10:15 with Dr Alphonsus Sias. Pt did not cancel appt on Mon with Dr Selena Batten until sees Dr Alphonsus Sias. Sending note to Dr Alphonsus Sias, Dr Selena Batten and Carollee Herter CMA. UC & ED precautions given and pt voiced understanding.

## 2020-12-09 ENCOUNTER — Other Ambulatory Visit: Payer: Self-pay

## 2020-12-09 ENCOUNTER — Encounter: Payer: Self-pay | Admitting: Internal Medicine

## 2020-12-09 ENCOUNTER — Ambulatory Visit: Payer: BC Managed Care – PPO | Admitting: Internal Medicine

## 2020-12-09 DIAGNOSIS — R1032 Left lower quadrant pain: Secondary | ICD-10-CM

## 2020-12-09 NOTE — Telephone Encounter (Signed)
OKay

## 2020-12-09 NOTE — Assessment & Plan Note (Signed)
Doesn't seem to be GI related Most likely muscular ??could be ovary  If persists, would check pelvic ultrasound Would try NSAIDs--if pain more intense

## 2020-12-09 NOTE — Progress Notes (Signed)
Subjective:    Patient ID: Kristin Hunter, female    DOB: 09-05-1970, 50 y.o.   MRN: [id number]  HPI Here due to low abdominal pain--near hip This visit occurred during the SARS-CoV-2 public health emergency.  Safety protocols were in place, including screening questions prior to the visit, additional usage of staff PPE, and extensive cleaning of exam room while observing appropriate contact time as indicated for disinfecting solutions.   Started about 10 days ago Thought it could be her kidney--she drinks a lot of tea Started in LLQ--but has moved towards her hip Can actually have some trouble standing up ---like a "strain" over the anterior left hip Does a lot of walking and exercise classes---no injury  Feels "like a pulled something" Tried tylenol--no help  Current Outpatient Medications on File Prior to Visit  Medication Sig Dispense Refill   estradiol (VIVELLE-DOT) 0.025 MG/24HR Place onto the skin.     lisinopril (ZESTRIL) 20 MG tablet Take 1 tablet (20 mg total) by mouth daily. 90 tablet 3   Multiple Vitamin (MULTI-VITAMINS) TABS Take by mouth.     OVER THE COUNTER MEDICATION daily. BIOTIN ORAL      VITAMIN D, CHOLECALCIFEROL, PO Take by mouth every other day.     No current facility-administered medications on file prior to visit.    No Known Allergies  Past Medical History:  Diagnosis Date   Headache    Hyperthyroidism     Past Surgical History:  Procedure Laterality Date   ABDOMINAL HYSTERECTOMY     CHOLECYSTECTOMY     NASAL TURBINATE REDUCTION Bilateral 11/02/2016   Procedure: TURBINATE REDUCTION/SUBMUCOSAL RESECTION;  Surgeon: Linus Salmons, MD;  Location: [facility];  Service: ENT;  Laterality: Bilateral;   PARTIAL HYSTERECTOMY     SEPTOPLASTY Bilateral 11/02/2016   Procedure: SEPTOPLASTY;  Surgeon: Linus Salmons, MD;  Location: [facility];  Service: ENT;  Laterality: Bilateral;    Family History  Problem Relation Age of Onset    Breast cancer Maternal Aunt        late 40's   Hypertension Mother    Thyroid disease Mother    Hypertension Father    Heart attack Maternal Grandmother 76   Heart attack Maternal Grandfather 72   Diabetes Maternal Grandfather     Social History   Socioeconomic History   Marital status: Married    Spouse name: [name]   Number of children: 2   Years of education: Grad school   Highest education level: Not on file  Occupational History   Not on file  Tobacco Use   Smoking status: Never   Smokeless tobacco: Never  Vaping Use   Vaping Use: Never used  Substance and Sexual Activity   Alcohol use: No   Drug use: No   Sexual activity: Yes    Birth control/protection: Surgical  Other Topics Concern   Not on file  Social History Narrative   04/22/19   From: the area   Living: with husband [name]    Work: getting an Set designer currently Firefighter at [employer]      Family: 2 sons - [place] and [place] - both adults      Enjoys: yoga, walking, watching a movie, reading      Exercise: not as good with the weather, yoga 2-3 times per week   Diet: veggies, eating out a lot       Safety   Seat belts: Yes    Guns: Yes  and secure  Safe in relationships: Yes    Social Determinants of Corporate investment banker Strain: Not on file  Food Insecurity: Not on file  Transportation Needs: Not on file  Physical Activity: Not on file  Stress: Not on file  Social Connections: Not on file  Intimate Partner Violence: Not on file   Review of Systems No N/V Appetite is fine Bowels are normal No pain or blood with urination No vaginal bleeding    Objective:   Physical Exam Constitutional:      Appearance: Normal appearance.  Pulmonary:     Effort: Pulmonary effort is normal.     Breath sounds: Normal breath sounds. No wheezing or rales.  Abdominal:     General: Bowel sounds are normal.     Palpations: Abdomen is soft.     Tenderness: There is no guarding or rebound.     Hernia: No  hernia is present.     Comments: No femoral hernia Slight tenderness with deep palpation LLQ  Musculoskeletal:     Comments: Normal ROM in hips SLR negative  Neurological:     Mental Status: She is alert.     Comments: Gait normal No leg weakness           Assessment & Plan:

## 2020-12-12 ENCOUNTER — Ambulatory Visit: Payer: BC Managed Care – PPO | Admitting: Family Medicine

## 2021-01-10 DIAGNOSIS — U071 COVID-19: Secondary | ICD-10-CM

## 2021-01-10 HISTORY — DX: COVID-19: U07.1

## 2021-02-06 ENCOUNTER — Encounter: Payer: Self-pay | Admitting: Family Medicine

## 2021-02-06 DIAGNOSIS — Z20822 Contact with and (suspected) exposure to covid-19: Secondary | ICD-10-CM | POA: Diagnosis not present

## 2021-02-06 DIAGNOSIS — U071 COVID-19: Secondary | ICD-10-CM | POA: Diagnosis not present

## 2021-03-12 HISTORY — PX: BREAST EXCISIONAL BIOPSY: SUR124

## 2021-03-20 ENCOUNTER — Other Ambulatory Visit: Payer: Self-pay | Admitting: Family Medicine

## 2021-03-20 DIAGNOSIS — I10 Essential (primary) hypertension: Secondary | ICD-10-CM

## 2021-04-17 ENCOUNTER — Telehealth: Payer: Self-pay

## 2021-04-17 ENCOUNTER — Other Ambulatory Visit: Payer: Self-pay

## 2021-04-17 ENCOUNTER — Ambulatory Visit: Payer: BC Managed Care – PPO | Admitting: Family

## 2021-04-17 VITALS — BP 128/84 | HR 88 | Temp 96.5°F | Ht 62.0 in | Wt 134.0 lb

## 2021-04-17 DIAGNOSIS — R1011 Right upper quadrant pain: Secondary | ICD-10-CM | POA: Insufficient documentation

## 2021-04-17 DIAGNOSIS — R1013 Epigastric pain: Secondary | ICD-10-CM | POA: Insufficient documentation

## 2021-04-17 DIAGNOSIS — Z23 Encounter for immunization: Secondary | ICD-10-CM | POA: Insufficient documentation

## 2021-04-17 DIAGNOSIS — R12 Heartburn: Secondary | ICD-10-CM

## 2021-04-17 DIAGNOSIS — R1012 Left upper quadrant pain: Secondary | ICD-10-CM | POA: Insufficient documentation

## 2021-04-17 DIAGNOSIS — M62830 Muscle spasm of back: Secondary | ICD-10-CM

## 2021-04-17 DIAGNOSIS — R350 Frequency of micturition: Secondary | ICD-10-CM

## 2021-04-17 DIAGNOSIS — R1032 Left lower quadrant pain: Secondary | ICD-10-CM

## 2021-04-17 LAB — POC URINALSYSI DIPSTICK (AUTOMATED)
Bilirubin, UA: NEGATIVE
Blood, UA: NEGATIVE
Glucose, UA: NEGATIVE
Leukocytes, UA: NEGATIVE
Nitrite, UA: NEGATIVE
Protein, UA: NEGATIVE
Spec Grav, UA: 1.025 (ref 1.010–1.025)
Urobilinogen, UA: 0.2 E.U./dL
pH, UA: 5.5 (ref 5.0–8.0)

## 2021-04-17 MED ORDER — OMEPRAZOLE 20 MG PO CPDR: 20.0000 mg | DELAYED_RELEASE_CAPSULE | Freq: Every day | ORAL | 0 refills | Status: DC

## 2021-04-17 MED ORDER — CYCLOBENZAPRINE HCL 5 MG PO TABS: 5.0000 mg | ORAL_TABLET | Freq: Three times a day (TID) | ORAL | 1 refills | Status: DC | PRN

## 2021-04-17 NOTE — Telephone Encounter (Signed)
[facility] Day - Client TELEPHONE ADVICE RECORD [facility] Patient Name: Kristin Hunter ITH Gender: Female DOB: Jul 18, 1970 Age: 51 Y 2 M 2 D Return Phone Number: [phone] (Primary), [phone] (Secondary) Address: City/ State/ Zip: [place]  [address] Client [facility] Day - Client Client Site [facility] - Day Provider Gweneth Dimitri- MD Contact Type Call Who Is Calling Patient / Member / Family / Caregiver Call Type Triage / Clinical Relationship To Patient Self Return Phone Number [phone] (Primary) Chief Complaint CHEST PAIN - pain, pressure, heaviness or tightness Reason for Call Symptomatic / Request for Health Information Initial Comment Caller says that she has had constant soreness in the mid section of her upper back lately and it got really bad over the weekend. The pain loops around the left side of her ribs under her breast. Translation No Nurse Assessment Nurse: Yetta Barre, RN, Miranda Date/Time (Eastern Time): 04/17/2021 9:31:14 AM Confirm and document reason for call. If symptomatic, describe symptoms. ---Caller states she has been having pain in the middle back 3-4 days ago. Over the weekend, she started having pain wrapping around her left side in to the upper part of her abdomen. She feels like it is kidney area. Does the patient have any new or worsening symptoms? ---Yes Will a triage be completed? ---Yes Related visit to physician within the last 2 weeks? ---No Does the PT have any chronic conditions? (i.e. diabetes, asthma, this includes High risk factors for pregnancy, etc.) ---Yes List chronic conditions. ---HTN Is the patient pregnant or possibly pregnant? (Ask all females between the ages of 27-55) ---No Is this a behavioral health or substance abuse call? ---No Guidelines Guideline Title Affirmed Question Affirmed Notes Nurse Date/Time (Eastern Time) Flank Pain  [1] MILD pain (i.e., scale 1-3; does not interfere with normal Yetta Barre, RN, Miranda 04/17/2021 9:34:14 AM PLEASE NOTE: All timestamps contained within this report are represented as [place] Standard Time. CONFIDENTIALTY NOTICE: This fax transmission is intended only for the addressee. It contains information that is legally privileged, confidential or otherwise protected from use or disclosure. If you are not the intended recipient, you are strictly prohibited from reviewing, disclosing, copying using or disseminating any of this information or taking any action in reliance on or regarding this information. If you have received this fax in error, please notify us immediately by telephone so that we can arrange for its return to [place]. Phone: [phone], Toll-Free: [phone], Fax: [phone] Page: 2 of 2 Call Id: [id number] Guidelines Guideline Title Affirmed Question Affirmed Notes Nurse Date/Time Lamount Cohen Time) activities) AND [2] present > 3 days Disp. Time Lamount Cohen Time) Disposition Final User 04/17/2021 9:27:32 AM Send to Urgent Ladoris Gene 04/17/2021 9:37:28 AM SEE PCP WITHIN 3 DAYS Yes Yetta Barre, RN, Miranda Caller Disagree/Comply Comply Caller Understands Yes PreDisposition Call Doctor Care Advice Given Per Guideline SEE PCP WITHIN 3 DAYS: * You need to be seen within 2 or 3 days. * PCP VISIT: Call your doctor (or NP/PA) during regular office hours and make an appointment. A clinic or urgent care center are good places to go for care if your doctor's office is closed or you can't get an appointment. NOTE: If office will be open tomorrow, tell caller to call then, not in 3 days. PAIN MEDICINES: * For pain relief, you can take either acetaminophen, ibuprofen, or naproxen. * They are over-the-counter (OTC) pain drugs. You can buy them at the drugstore. CALL BACK IF: * Fever over  100.4 F (38.0 C) * Burning with urination or blood in urine * You become worse CARE ADVICE given per Flank  Pain (Adult) guideline

## 2021-04-17 NOTE — Progress Notes (Signed)
Established Patient Office Visit  Subjective:  Patient ID: Kristin Hunter, female    DOB: [birth year]  Age: 51 y.o. MRN: [id number]  CC:  Chief Complaint  Patient presents with   Back Pain    Thoracic area around left flank into rib area under L breast. Started 5 days ago but yesterday was worse pain. Heat and ibuprofen made it better.     HPI Kristin Hunter is here today with concerns.   Five days ago started with mid back pain that now is radiating across the bra line to under the left breast. The pain is more of an aching dull pain and intermittent. Yesterday during church when she stood up to sing, she felt the achiness begin to move and almost knocked her off her feet' from the tenderness. She got some cranberry juice because she thought maybe kidney pain, and then again felt left upper back pain again that was almost debilitating. Applied heating pad all last night with motrin and increased intake of water and cranberry juice with much relief however this am when she woke up she did feel the pain radiate with movement .   Two weeks ago did have a strong urinary odor, and she does state she drinks a lot of tea. Denies urinary frequency. Denies dysuria. No blood seen in the urine.   No SOB, no DOE, no chest pain.  No nausea/vomiting.   Does have noted increased belching with increasing acid reflux, feeling of discomfort and bloating epigastric area. Bowel movements are regular, but with bloating does drink hot cocoa to get bowels moving which has been helping and giving her relief of the 'air bubbles' does take tums for the reflux with mild relief.   Past Medical History:  Diagnosis Date   Headache    Hyperthyroidism     Past Surgical History:  Procedure Laterality Date   ABDOMINAL HYSTERECTOMY     CHOLECYSTECTOMY     NASAL TURBINATE REDUCTION Bilateral 11/02/2016   Procedure: TURBINATE REDUCTION/SUBMUCOSAL RESECTION;  Surgeon: Linus Salmons, MD;  Location: [facility];  Service: ENT;  Laterality: Bilateral;   PARTIAL HYSTERECTOMY     SEPTOPLASTY Bilateral 11/02/2016   Procedure: SEPTOPLASTY;  Surgeon: Linus Salmons, MD;  Location: [facility];  Service: ENT;  Laterality: Bilateral;    Family History  Problem Relation Age of Onset   Breast cancer Maternal Aunt        late 40's   Hypertension Mother    Thyroid disease Mother    Hypertension Father    Heart attack Maternal Grandmother 80   Heart attack Maternal Grandfather 72   Diabetes Maternal Grandfather     Social History   Socioeconomic History   Marital status: Married    Spouse name: [name]   Number of children: 2   Years of education: Grad school   Highest education level: Not on file  Occupational History   Not on file  Tobacco Use   Smoking status: Never   Smokeless tobacco: Never  Vaping Use   Vaping Use: Never used  Substance and Sexual Activity   Alcohol use: No   Drug use: No   Sexual activity: Yes    Birth control/protection: Surgical  Other Topics Concern   Not on file  Social History Narrative   04/22/19   From: the area   Living: with husband [name]    Work: getting an Set designer currently Firefighter at [employer]      Family: 2  sons - Einar Pheasant and Delilah Shan - both adults      Enjoys: yoga, walking, watching a movie, reading      Exercise: not as good with the weather, yoga 2-3 times per week   Diet: veggies, eating out a lot       Safety   Seat belts: Yes    Guns: Yes  and secure   Safe in relationships: Yes    Social Determinants of Health   Financial Resource Strain: Not on file  Food Insecurity: Not on file  Transportation Needs: Not on file  Physical Activity: Not on file  Stress: Not on file  Social Connections: Not on file  Intimate Partner Violence: Not on file    Outpatient Medications Prior to Visit  Medication Sig Dispense Refill   estradiol (VIVELLE-DOT) 0.025 MG/24HR Place onto the skin.     lisinopril (ZESTRIL) 20 MG tablet Take 1  tablet (20 mg total) by mouth daily. 90 tablet 1   Multiple Vitamin (MULTI-VITAMINS) TABS Take by mouth.     OVER THE COUNTER MEDICATION daily. BIOTIN ORAL      VITAMIN D, CHOLECALCIFEROL, PO Take by mouth every other day.     No facility-administered medications prior to visit.    No Known Allergies  ROS Review of Systems  Constitutional:  Negative for chills, fever and unexpected weight change.  HENT:  Negative for sore throat.   Gastrointestinal:  Positive for abdominal distention (bloating epigastric area), abdominal pain (epigastric tenderness) and nausea. Negative for constipation, diarrhea and vomiting.       Increased belching and heartburn   Genitourinary:  Positive for frequency. Negative for decreased urine volume, difficulty urinating, dysuria, flank pain, hematuria and vaginal discharge. Genital sores: improving.      Urinary odor   Musculoskeletal:  Positive for back pain (mid back pain radiating aross to under left breast, aching dull pain).     Objective:    Physical Exam Constitutional:      General: She is not in acute distress.    Appearance: Normal appearance. She is normal weight. She is not ill-appearing, toxic-appearing or diaphoretic.  Cardiovascular:     Rate and Rhythm: Normal rate and regular rhythm.  Pulmonary:     Effort: Pulmonary effort is normal.     Breath sounds: Normal breath sounds.  Abdominal:     General: Abdomen is flat. Bowel sounds are normal.     Palpations: Abdomen is soft.     Tenderness: There is abdominal tenderness (mild tenderness upper quadrant including ruq, luq and epigastric). There is no guarding or rebound.     Hernia: No hernia is present.  Musculoskeletal:        General: No swelling. Normal range of motion.     Cervical back: Spasms (refer to 1) and tenderness (refer to figure 1) present.       Back:  Skin:    General: Skin is warm.     Findings: No rash.  Neurological:     General: No focal deficit present.      Mental Status: She is alert and oriented to person, place, and time.  Psychiatric:        Mood and Affect: Mood normal.        Behavior: Behavior normal.        Thought Content: Thought content normal.    BP 128/84    Pulse 88    Temp (!) 96.5 F (35.8 C) (Temporal)    Ht 5\' 2"  (  1.575 m)    Wt 134 lb (60.8 kg)    LMP 11/27/2010    SpO2 98%    BMI 24.51 kg/m  Wt Readings from Last 3 Encounters:  04/17/21 134 lb (60.8 kg)  12/09/20 136 lb (61.7 kg)  09/13/20 137 lb 8 oz (62.4 kg)     Health Maintenance Due  Topic Date Due   COLONOSCOPY (Pts 45-27yrs Insurance coverage will need to be confirmed)  Never done   Zoster Vaccines- Shingrix (1 of 2) Never done   COVID-19 Vaccine (4 - Booster for Pfizer series) 04/06/2020   INFLUENZA VACCINE  10/10/2020    There are no preventive care reminders to display for this patient.  Lab Results  Component Value Date   TSH 0.70 05/20/2019   Lab Results  Component Value Date   WBC 4.2 05/20/2019   HGB 13.9 05/20/2019   HCT 41.1 05/20/2019   MCV 94.9 05/20/2019   PLT 372.0 05/20/2019   Lab Results  Component Value Date   NA 138 09/13/2020   K 3.6 09/13/2020   CO2 27 09/13/2020   GLUCOSE 113 (H) 09/13/2020   BUN 15 09/13/2020   CREATININE 0.72 09/13/2020   BILITOT 0.2 09/13/2020   ALKPHOS 58 09/13/2020   AST 11 09/13/2020   ALT 9 09/13/2020   PROT 7.2 09/13/2020   ALBUMIN 4.3 09/13/2020   CALCIUM 9.4 09/13/2020   GFR 97.38 09/13/2020   Lab Results  Component Value Date   HGBA1C 5.5 12/23/2018      Assessment & Plan:   Problem List Items Addressed This Visit       Other   Urinary frequency    Urine with culture ordered pending culture.      Relevant Orders   Urine Culture   POCT Urinalysis Dipstick (Automated) (Completed)   Urinalysis, Routine w reflex microscopic   Heart burn    Trial ppi, omeprazole sent to pharmacy. Pt advised to try to decrease and or avoid spicy foods, fried fatty foods, and also caffeine and  chocolate as these can increase heartburn symptoms.        Relevant Medications   omeprazole (PRILOSEC) 20 MG capsule   LLQ abdominal pain    U/a with culture pending      Right upper quadrant abdominal pain    Abdominal panel labworkup in process, pending results. If negative will consider abdominal u/s although suspect this is due to acid reflux, pending ppi trial as well.       Relevant Orders   Comprehensive metabolic panel   Epigastric pain    Order Diflucan and H. pylori.  Also ordered amylase and lipase for abdominal work-up.  If all these are negative will consider abdominal ultrasound.      Relevant Orders   Comprehensive metabolic panel   H. pylori antibody, IgG   Alpha-Gal Panel   Left upper quadrant abdominal pain    Abdominal workup in place, no longer with gallbladder. R/o liver with lft pending results. If still with pain, and labs wnl will consider abdominal u/s      Relevant Orders   Amylase   Lipase   CBC w/Diff   Comprehensive metabolic panel   Need for influenza vaccination - Primary    Flu vaccine administered in office, pt tolerated well.       Relevant Orders   Flu Vaccine QUAD 9mo+IM (Fluarix, Fluzone & Alfiuria Quad PF)   Muscle spasm of back    Suspected spasm, rx flexeril, may  cause sedation so only take when able to sleep and or not driving or working. Heat/ice to site prn. Ibuprofen prn.       Relevant Medications   cyclobenzaprine (FLEXERIL) 5 MG tablet    Meds ordered this encounter  Medications   omeprazole (PRILOSEC) 20 MG capsule    Sig: Take 1 capsule (20 mg total) by mouth daily.    Dispense:  30 capsule    Refill:  0    Order Specific Question:   Supervising Provider    Answer:   BEDSOLE, AMY E [id number]]   cyclobenzaprine (FLEXERIL) 5 MG tablet    Sig: Take 1 tablet (5 mg total) by mouth 3 (three) times daily as needed for muscle spasms.    Dispense:  20 tablet    Refill:  1    Order Specific Question:   Supervising Provider     Answer:   BEDSOLE, AMY E [id number]    Follow-up: Return if symptoms worsen or fail to improve.    Eugenia Pancoast, FNP

## 2021-04-17 NOTE — Telephone Encounter (Signed)
Pt already has scheduled appt with T Dugal FNP 04/17/21 at 2:20. Pt said is a soreness rather than a pain. Pt said goes from back at bra line to the front of chest.pain level now is 3. Pt said movement does not make soreness worse. Pt has used heating pad and drinking a lot of water and cranberry juice and area is not as sore as yesterday. No burning or pain or frequency of urine. No constipation and no diarrhea. Pt has not done any heavy lifting or moving anything but area almost feels like she has pulled something. Pt denies any covid symptoms; no fever or cough. No vomiting.pt denies any rash or redness.  Pt said she does not need to go to UC or ED due to not really having pain but more of a soreness. Sending note to T Dugal FNP and [facility] CMA.

## 2021-04-17 NOTE — Patient Instructions (Addendum)
Stop by the lab prior to leaving today. I will notify you of your results once received.   Please start trial of omeprazole as prescribed. Take this medication for two weeks, administer 30 minutes prior to breakfast each am. Try to decrease and or avoid spicy foods, fried fatty foods, and also caffeine and chocolate as these can increase heartburn symptoms.   If lab work unremarkable, will order ultrasound abdomen to r/o other concerns.   Also sent muscle relaxer to pharmacy, take when able to sleep after as it may cause drowsiness. Heat/ice as needed.  It was a pleasure seeing you today! Please do not hesitate to reach out with any questions and or concerns.  Regards,   Mort Sawyers FNP-C

## 2021-04-18 DIAGNOSIS — M62830 Muscle spasm of back: Secondary | ICD-10-CM | POA: Insufficient documentation

## 2021-04-18 LAB — URINE CULTURE
MICRO NUMBER:: 12968048
Result:: NO GROWTH
SPECIMEN QUALITY:: ADEQUATE

## 2021-04-18 LAB — COMPREHENSIVE METABOLIC PANEL
ALT: 7 U/L (ref 0–35)
AST: 11 U/L (ref 0–37)
Albumin: 4.1 g/dL (ref 3.5–5.2)
Alkaline Phosphatase: 56 U/L (ref 39–117)
BUN: 11 mg/dL (ref 6–23)
CO2: 32 mEq/L (ref 19–32)
Calcium: 9.2 mg/dL (ref 8.4–10.5)
Chloride: 104 mEq/L (ref 96–112)
Creatinine, Ser: 0.89 mg/dL (ref 0.40–1.20)
GFR: 75.19 mL/min (ref >60.00)
Glucose, Bld: 79 mg/dL (ref 70–99)
Potassium: 4 mEq/L (ref 3.5–5.1)
Sodium: 139 mEq/L (ref 135–145)
Total Bilirubin: 0.3 mg/dL (ref 0.2–1.2)
Total Protein: 7.1 g/dL (ref 6.0–8.3)

## 2021-04-18 LAB — CBC WITH DIFFERENTIAL/PLATELET
Basophils Absolute: 0.1 10*3/uL (ref 0.0–0.1)
Basophils Relative: 1.2 % (ref 0.0–3.0)
Eosinophils Absolute: 0.1 10*3/uL (ref 0.0–0.7)
Eosinophils Relative: 1.4 % (ref 0.0–5.0)
HCT: 37.8 % (ref 36.0–46.0)
Hemoglobin: 12.6 g/dL (ref 12.0–15.0)
Lymphocytes Relative: 36.1 % (ref 12.0–46.0)
Lymphs Abs: 1.7 10*3/uL (ref 0.7–4.0)
MCHC: 33.4 g/dL (ref 30.0–36.0)
MCV: 92.4 fl (ref 78.0–100.0)
Monocytes Absolute: 0.4 10*3/uL (ref 0.1–1.0)
Monocytes Relative: 8.8 % (ref 3.0–12.0)
Neutro Abs: 2.4 10*3/uL (ref 1.4–7.7)
Neutrophils Relative %: 52.5 % (ref 43.0–77.0)
Platelets: 359 10*3/uL (ref 150.0–400.0)
RBC: 4.09 Mil/uL (ref 3.87–5.11)
RDW: 13.6 % (ref 11.5–15.5)
WBC: 4.6 10*3/uL (ref 4.0–10.5)

## 2021-04-18 LAB — URINALYSIS, ROUTINE W REFLEX MICROSCOPIC
Bilirubin Urine: NEGATIVE
Hgb urine dipstick: NEGATIVE
Leukocytes,Ua: NEGATIVE
Nitrite: NEGATIVE
Specific Gravity, Urine: 1.02 (ref 1.000–1.030)
Total Protein, Urine: NEGATIVE
Urine Glucose: NEGATIVE
Urobilinogen, UA: 0.2 (ref 0.0–1.0)
pH: 6 (ref 5.0–8.0)

## 2021-04-18 LAB — LIPASE: Lipase: 25 U/L (ref 11.0–59.0)

## 2021-04-18 LAB — AMYLASE: Amylase: 46 U/L (ref 27–131)

## 2021-04-18 LAB — H. PYLORI ANTIBODY, IGG: H Pylori IgG: NEGATIVE

## 2021-04-18 NOTE — Assessment & Plan Note (Signed)
Urine with culture ordered pending culture.

## 2021-04-18 NOTE — Assessment & Plan Note (Signed)
Suspected spasm, rx flexeril, may cause sedation so only take when able to sleep and or not driving or working. Heat/ice to site prn. Ibuprofen prn.

## 2021-04-18 NOTE — Assessment & Plan Note (Signed)
Trial ppi, omeprazole sent to pharmacy. Pt advised to try to decrease and or avoid spicy foods, fried fatty foods, and also caffeine and chocolate as these can increase heartburn symptoms.

## 2021-04-18 NOTE — Assessment & Plan Note (Signed)
Order Diflucan and H. pylori.  Also ordered amylase and lipase for abdominal work-up.  If all these are negative will consider abdominal ultrasound.

## 2021-04-18 NOTE — Assessment & Plan Note (Signed)
Flu vaccine administered in office, pt tolerated well.

## 2021-04-18 NOTE — Assessment & Plan Note (Signed)
Abdominal panel labworkup in process, pending results. If negative will consider abdominal u/s although suspect this is due to acid reflux, pending ppi trial as well.

## 2021-04-18 NOTE — Assessment & Plan Note (Signed)
Abdominal workup in place, no longer with gallbladder. R/o liver with lft pending results. If still with pain, and labs wnl will consider abdominal u/s

## 2021-04-18 NOTE — Assessment & Plan Note (Signed)
U/a with culture pending

## 2021-04-20 LAB — ALPHA-GAL PANEL
Beef IgE: <0.1 kU/L (ref <0.35)
Class: 0
Class: 0
Class: 0
Galactose-alpha-1,3-galactose IgE: <0.1 kU/L (ref <0.10)
LAMB/MUTTON IGE: <0.1 kU/L (ref <0.35)
Pork IgE: <0.1 kU/L (ref <0.35)

## 2021-05-09 ENCOUNTER — Other Ambulatory Visit: Payer: Self-pay | Admitting: Family

## 2021-05-09 DIAGNOSIS — R12 Heartburn: Secondary | ICD-10-CM

## 2021-08-01 ENCOUNTER — Other Ambulatory Visit: Payer: Self-pay | Admitting: Obstetrics and Gynecology

## 2021-08-01 ENCOUNTER — Ambulatory Visit (INDEPENDENT_AMBULATORY_CARE_PROVIDER_SITE_OTHER): Payer: BC Managed Care – PPO | Admitting: Family Medicine

## 2021-08-01 VITALS — BP 126/80 | HR 82 | Temp 97.6°F | Ht 62.0 in | Wt 133.5 lb

## 2021-08-01 DIAGNOSIS — J0140 Acute pansinusitis, unspecified: Secondary | ICD-10-CM

## 2021-08-01 DIAGNOSIS — Z1231 Encounter for screening mammogram for malignant neoplasm of breast: Secondary | ICD-10-CM

## 2021-08-01 DIAGNOSIS — H6123 Impacted cerumen, bilateral: Secondary | ICD-10-CM | POA: Diagnosis not present

## 2021-08-01 MED ORDER — AMOXICILLIN-POT CLAVULANATE 875-125 MG PO TABS: 1.0000 | ORAL_TABLET | Freq: Two times a day (BID) | ORAL | 0 refills | Status: AC

## 2021-08-01 NOTE — Progress Notes (Signed)
Subjective:     Kristin Hunter is a 51 y.o. female presenting for Nasal Congestion (X 10 days with Green discharge) and Cough (Tested negative for covid on 07/27/21)     Cough Associated symptoms include chest pain, headaches and a sore throat. Pertinent negatives include no chills, ear pain, fever, myalgias or shortness of breath.  Sinusitis This is a new problem. The current episode started 1 to 4 weeks ago. Associated symptoms include coughing, headaches, sneezing and a sore throat. Pertinent negatives include no chills, congestion, ear pain, shortness of breath, sinus pressure or swollen glands. Treatments tried: allegra, mucinex.    Review of Systems  Constitutional:  Negative for chills and fever.  HENT:  Positive for sneezing and sore throat. Negative for congestion, dental problem, ear pain and sinus pressure.   Respiratory:  Positive for cough. Negative for shortness of breath.   Cardiovascular:  Positive for chest pain.  Gastrointestinal:  Negative for diarrhea, nausea and vomiting.  Musculoskeletal:  Negative for arthralgias and myalgias.  Neurological:  Positive for headaches.    Social History   Tobacco Use  Smoking Status Never  Smokeless Tobacco Never        Objective:    BP Readings from Last 3 Encounters:  08/01/21 126/80  04/17/21 128/84  12/09/20 122/84   Wt Readings from Last 3 Encounters:  08/01/21 133 lb 8 oz (60.6 kg)  04/17/21 134 lb (60.8 kg)  12/09/20 136 lb (61.7 kg)    BP 126/80   Pulse 82   Temp 97.6 F (36.4 C) (Temporal)   Ht 5\' 2"  (1.575 m)   Wt 133 lb 8 oz (60.6 kg)   LMP 11/27/2010   SpO2 98%   BMI 24.42 kg/m    Physical Exam Constitutional:      General: She is not in acute distress.    Appearance: She is well-developed. She is not diaphoretic.  HENT:     Head: Normocephalic and atraumatic.     Right Ear: Tympanic membrane and ear canal normal. There is impacted cerumen.     Left Ear: Tympanic membrane and ear canal  normal. There is impacted cerumen.     Ears:     Comments: Cerumen but partial view of TM normal b/l    Nose: Mucosal edema and rhinorrhea present.     Right Sinus: No maxillary sinus tenderness or frontal sinus tenderness.     Left Sinus: No maxillary sinus tenderness or frontal sinus tenderness.     Mouth/Throat:     Pharynx: Uvula midline. Posterior oropharyngeal erythema present. No oropharyngeal exudate.     Tonsils: 0 on the right. 0 on the left.  Eyes:     General: No scleral icterus.    Conjunctiva/sclera: Conjunctivae normal.  Cardiovascular:     Rate and Rhythm: Normal rate and regular rhythm.     Heart sounds: Normal heart sounds. No murmur heard. Pulmonary:     Effort: Pulmonary effort is normal. No respiratory distress.     Breath sounds: Normal breath sounds.  Musculoskeletal:     Cervical back: Neck supple.  Lymphadenopathy:     Cervical: No cervical adenopathy.  Skin:    General: Skin is warm and dry.     Capillary Refill: Capillary refill takes less than 2 seconds.  Neurological:     Mental Status: She is alert.          Assessment & Plan:   Problem List Items Addressed This Visit  Nervous and Auditory   Bilateral impacted cerumen    Due to current sinus infection, advised waiting for cerumen removal due to increase risk for side effects. Advised OTC drops and return if persisting       Other Visit Diagnoses     Acute non-recurrent pansinusitis    -  Primary   Relevant Medications   amoxicillin-clavulanate (AUGMENTIN) 875-125 MG tablet      Discussed given persistent symptoms reasonable to treat for sinus infection.  Though also discussed without congestion, face pain, dental pain this may be less likely.  She will continue Neti pot and start Flonase.  Update if symptoms do not resolve  Return if symptoms worsen or fail to improve.  Lynnda Child, MD

## 2021-08-01 NOTE — Patient Instructions (Signed)
Keep up the neti po Start flonase   Antibiotic if no improvement  If you start the antibiotic if completely better at 5 days can stop  Ear wax softening drops And can do the kit If no improvement can follow-up for ear cleaning

## 2021-08-01 NOTE — Assessment & Plan Note (Signed)
Due to current sinus infection, advised waiting for cerumen removal due to increase risk for side effects. Advised OTC drops and return if persisting

## 2021-08-23 ENCOUNTER — Other Ambulatory Visit: Payer: Self-pay | Admitting: Family Medicine

## 2021-08-23 DIAGNOSIS — I10 Essential (primary) hypertension: Secondary | ICD-10-CM

## 2021-08-29 ENCOUNTER — Ambulatory Visit
Admission: RE | Admit: 2021-08-29 | Discharge: 2021-08-29 | Disposition: A | Discharge status: Home / Self Care | Payer: BC Managed Care – PPO | Source: Ambulatory Visit | Attending: Obstetrics and Gynecology | Admitting: Obstetrics and Gynecology

## 2021-08-29 DIAGNOSIS — Z1231 Encounter for screening mammogram for malignant neoplasm of breast: Secondary | ICD-10-CM | POA: Diagnosis not present

## 2021-09-08 ENCOUNTER — Other Ambulatory Visit: Payer: Self-pay | Admitting: Obstetrics and Gynecology

## 2021-09-08 DIAGNOSIS — R928 Other abnormal and inconclusive findings on diagnostic imaging of breast: Secondary | ICD-10-CM

## 2021-09-08 DIAGNOSIS — R921 Mammographic calcification found on diagnostic imaging of breast: Secondary | ICD-10-CM

## 2021-09-09 DIAGNOSIS — I10 Essential (primary) hypertension: Secondary | ICD-10-CM | POA: Diagnosis not present

## 2021-09-13 DIAGNOSIS — I10 Essential (primary) hypertension: Secondary | ICD-10-CM | POA: Diagnosis not present

## 2021-09-13 DIAGNOSIS — S29012A Strain of muscle and tendon of back wall of thorax, initial encounter: Secondary | ICD-10-CM | POA: Diagnosis not present

## 2021-09-14 ENCOUNTER — Encounter: Payer: Self-pay | Admitting: Nurse Practitioner

## 2021-09-14 ENCOUNTER — Ambulatory Visit: Payer: BC Managed Care – PPO | Admitting: Nurse Practitioner

## 2021-09-14 VITALS — BP 150/80 | HR 84 | Temp 97.9°F | Resp 16 | Ht 62.0 in | Wt 129.0 lb

## 2021-09-14 DIAGNOSIS — I10 Essential (primary) hypertension: Secondary | ICD-10-CM

## 2021-09-14 DIAGNOSIS — T148XXA Other injury of unspecified body region, initial encounter: Secondary | ICD-10-CM | POA: Diagnosis not present

## 2021-09-14 NOTE — Assessment & Plan Note (Signed)
Given patient's history and exam most likely a muscle strain.  She can continue using the Salonpas patch, heat, methocarbamol, and 1 NSAID medication.  Patient has been using ibuprofen over-the-counter did tell her either use diclofenac or ibuprofen but not both.  Defer imaging for now.  Follow-up if no improvement

## 2021-09-14 NOTE — Patient Instructions (Signed)
Nice to see you today Continue taking the prescribed medications. If you take the Diclofenac pill do NOT take the ibuprofen, one or the other. Continue using the Methocarbamol, Salon pas, and heat.  Follow up if you do not improve or symptoms worse

## 2021-09-14 NOTE — Assessment & Plan Note (Signed)
Patient currently maintained on lisinopril 20 mg.  The past 2 office visits have been normotensive.  She is taking blood pressure medication as prescribed under lots of stress currently with building a new home and packing up her current home to place on the market.  Upon recheck blood pressure did come down considerably but still considered above goal.  Patient is asymptomatic did encourage patient to check blood pressure at home intermittently

## 2021-09-14 NOTE — Progress Notes (Signed)
Acute Office Visit  Subjective:     Patient ID: Kristin Hunter, female    DOB: 1971/02/03, 51 y.o.   MRN: [id number]  Chief Complaint  Patient presents with   Back Pain    X 4 days ago    HPI Patient is in today for back pain   Started approx 4 days ago. States that she is getting her house ready to but on the market. States that she did not feel it immediately after but started with muscle spasms the following day. They are increasing in frequency. She has not been able to elicit it with movement. States it feels like labor pains She has been using salon pas and ibuprofen  Does not check BP at home but has the ability to States that she went to the urgent care and was given methocarbamol and diclofenac.  States she is taking the methocarbamol with good relief after reading the drug pamphlet with diclofenac she was scared to take it as her blood pressure was elevated at the urgent care and slightly elevated today in office.  Review of Systems  Constitutional:  Negative for chills and fever.  Respiratory:  Negative for cough and shortness of breath.   Cardiovascular:  Negative for chest pain.  Gastrointestinal:        BM every day day  Neurological:  Negative for dizziness.        Objective:    BP (!) 150/80   Pulse 84   Temp 97.9 F (36.6 C)   Resp 16   Ht 5\' 2"  (1.575 m)   Wt 129 lb (58.5 kg)   LMP 11/27/2010   SpO2 99%   BMI 23.59 kg/m  BP Readings from Last 3 Encounters:  09/14/21 (!) 150/80  08/01/21 126/80  04/17/21 128/84   Wt Readings from Last 3 Encounters:  09/14/21 129 lb (58.5 kg)  08/01/21 133 lb 8 oz (60.6 kg)  04/17/21 134 lb (60.8 kg)      Physical Exam Vitals and nursing note reviewed.  Constitutional:      Appearance: Normal appearance.  Cardiovascular:     Rate and Rhythm: Normal rate and regular rhythm.     Heart sounds: Normal heart sounds.  Pulmonary:     Effort: Pulmonary effort is normal.     Breath sounds: Normal breath  sounds.  Musculoskeletal:       Arms:     Thoracic back: No tenderness or bony tenderness. Normal range of motion.     Comments: Described as slightly below the bra line.  Unable to elicit tenderness with palpation or range of motion in office  Neurological:     Mental Status: She is alert.     No results found for any visits on 09/14/21.      Assessment & Plan:   Problem List Items Addressed This Visit       Cardiovascular and Mediastinum   HTN (hypertension)    Patient currently maintained on lisinopril 20 mg.  The past 2 office visits have been normotensive.  She is taking blood pressure medication as prescribed under lots of stress currently with building a new home and packing up her current home to place on the market.  Upon recheck blood pressure did come down considerably but still considered above goal.  Patient is asymptomatic did encourage patient to check blood pressure at home intermittently        Musculoskeletal and Integument   Muscle strain - Primary  Given patient's history and exam most likely a muscle strain.  She can continue using the Salonpas patch, heat, methocarbamol, and 1 NSAID medication.  Patient has been using ibuprofen over-the-counter did tell her either use diclofenac or ibuprofen but not both.  Defer imaging for now.  Follow-up if no improvement       No orders of the defined types were placed in this encounter.   Return if symptoms worsen or fail to improve.  Audria Nine, NP

## 2021-09-25 DIAGNOSIS — Z01419 Encounter for gynecological examination (general) (routine) without abnormal findings: Secondary | ICD-10-CM | POA: Diagnosis not present

## 2021-09-25 LAB — HM PAP SMEAR: HM Pap smear: NEGATIVE

## 2021-09-25 LAB — RESULTS CONSOLE HPV: CHL HPV: NEGATIVE

## 2021-10-02 ENCOUNTER — Inpatient Hospital Stay: Admission: RE | Admit: 2021-10-02 | Payer: BC Managed Care – PPO | Source: Ambulatory Visit

## 2021-10-02 ENCOUNTER — Other Ambulatory Visit: Payer: BC Managed Care – PPO

## 2021-10-10 DIAGNOSIS — I10 Essential (primary) hypertension: Secondary | ICD-10-CM | POA: Diagnosis not present

## 2021-10-20 ENCOUNTER — Ambulatory Visit
Admission: RE | Admit: 2021-10-20 | Discharge: 2021-10-20 | Disposition: A | Discharge status: Home / Self Care | Payer: BC Managed Care – PPO | Source: Ambulatory Visit | Attending: Obstetrics and Gynecology | Admitting: Obstetrics and Gynecology

## 2021-10-20 DIAGNOSIS — N6489 Other specified disorders of breast: Secondary | ICD-10-CM | POA: Diagnosis not present

## 2021-10-20 DIAGNOSIS — R921 Mammographic calcification found on diagnostic imaging of breast: Secondary | ICD-10-CM

## 2021-10-20 DIAGNOSIS — R928 Other abnormal and inconclusive findings on diagnostic imaging of breast: Secondary | ICD-10-CM | POA: Diagnosis not present

## 2021-10-20 DIAGNOSIS — R922 Inconclusive mammogram: Secondary | ICD-10-CM | POA: Diagnosis not present

## 2021-10-25 ENCOUNTER — Other Ambulatory Visit: Payer: Self-pay | Admitting: Obstetrics and Gynecology

## 2021-10-25 DIAGNOSIS — R928 Other abnormal and inconclusive findings on diagnostic imaging of breast: Secondary | ICD-10-CM

## 2021-11-07 ENCOUNTER — Ambulatory Visit
Admission: RE | Admit: 2021-11-07 | Discharge: 2021-11-07 | Disposition: A | Discharge status: Home / Self Care | Payer: BC Managed Care – PPO | Source: Ambulatory Visit | Attending: Obstetrics and Gynecology | Admitting: Obstetrics and Gynecology

## 2021-11-07 DIAGNOSIS — R928 Other abnormal and inconclusive findings on diagnostic imaging of breast: Secondary | ICD-10-CM

## 2021-11-07 DIAGNOSIS — N6031 Fibrosclerosis of right breast: Secondary | ICD-10-CM | POA: Diagnosis not present

## 2021-11-07 HISTORY — PX: BREAST BIOPSY: SHX20

## 2021-11-09 LAB — SURGICAL PATHOLOGY

## 2021-11-10 DIAGNOSIS — I10 Essential (primary) hypertension: Secondary | ICD-10-CM | POA: Diagnosis not present

## 2021-11-14 ENCOUNTER — Encounter: Payer: Self-pay | Admitting: *Deleted

## 2021-11-14 NOTE — Progress Notes (Signed)
Referral recieved from [facility] for benign breast mass.  Appointment scheduled for surgical consultation with Dr. Maia Plan on 11/24/21 at 9:15.  No further needs at this time.

## 2021-11-24 ENCOUNTER — Other Ambulatory Visit: Payer: Self-pay | Admitting: Family Medicine

## 2021-11-24 ENCOUNTER — Ambulatory Visit: Payer: Self-pay | Admitting: General Surgery

## 2021-11-24 ENCOUNTER — Other Ambulatory Visit: Payer: Self-pay | Admitting: General Surgery

## 2021-11-24 DIAGNOSIS — N6489 Other specified disorders of breast: Secondary | ICD-10-CM

## 2021-11-24 NOTE — H&P (Signed)
PATIENT PROFILE: Kristin Hunter is a 51 y.o. female who presents to the Clinic for consultation at the request of Dr. Leafy Hunter for evaluation of right breast radial scar.  PCP:  Kristin Hunter PRESENT ILLNESS: Kristin Hunter reports she had a usual screening mammogram this year and she was found with distortion of the right breast.  No normality in the left breast.  Patient denies any palpable masses.  She has had chronic left breast pain.  She denies any nipple discharge.  Due to the distortion of the screening mammogram on the right breast, she had diagnostic mammogram and showing persistent distortion.  This led to core biopsy of the right breast.  This shows right breast radial scar with atypia.  Family history of breast cancer: On from other site Family history of other cancers: None Menarche: 12 Menopause: 20 due to partial hysterectomy for fibroids Used estrogen and progesterone therapy: Estradiol History of Radiation to the chest: Denies Number of pregnancies: 2 Age for pregnancy: 22 No previous breast biopsy  PROBLEM LIST: Problem List  Date Reviewed: 08/22/2020          Noted   Muscle strain 09/14/2021   Overview    Last Assessment & Plan: Formatting of this note might be different from the original. Given patient's history and exam most likely a muscle strain.  She can continue using the Salonpas patch, heat, methocarbamol, and 1 NSAID medication.  Patient has been using ibuprofen over-the-counter did tell her either use diclofenac or ibuprofen but not both.  Defer imaging for now.  Follow-up if no improvement      Bilateral impacted cerumen 08/01/2021   Overview    Last Assessment & Plan: Formatting of this note might be different from the original. Due to current sinus infection, advised waiting for cerumen removal due to increase risk for side effects. Advised OTC drops and return if persisting      Muscle spasm of back 04/18/2021   Overview     Last Assessment & Plan:  Formatting of this note might be different from the original. Suspected spasm, rx flexeril, may cause sedation so only take when able to sleep and or not driving or working. Heat/ice to site prn. Ibuprofen prn.      Epigastric pain 04/17/2021   Overview    Last Assessment & Plan:  Formatting of this note might be different from the original. Order Diflucan and H. pylori.  Also ordered amylase and lipase for abdominal work-up.  If all these are negative will consider abdominal ultrasound.      Heartburn 04/22/2019   Overview    Last Assessment & Plan:  Formatting of this note might be different from the original. Suspect epigastric fullness is heartburn related. Life style changes first. Omeprazole if no improvement. If not improving will consider h pylori testing and or GI referral      Left-sided Bell's palsy 02/20/2019   Overview    Last Assessment & Plan:  Formatting of this note might be different from the original. Very mild case- she seems to be able to close her eye fully, no facial droop, normal sensation.  Has already seen her eye doctor. Use Natural Tears to keep eye moist during day.   Brush your teeth 3 times a day after meals. Perform the manual exercises for the right facial muscles as many times a day as possible.  CBC, Fasting glucose, lyme titer, rheumatoid factor, HIV, ANA, ESR, ACE, VDRL  Prednisone 60 mg every morning with food, Valtrex 1000 mg three times daily x 1 week.      Well woman exam without gynecological exam 12/23/2018   Overview    Last Assessment & Plan:  Formatting of this note might be different from the original. Reviewed preventive care protocols, scheduled due services, and updated immunizations Discussed nutrition, exercise, diet, and healthy lifestyle.      HTN (hypertension) 06/27/2016   Overview    Last Assessment & Plan:  Formatting of this note might be different from the original. BP at goal. Continue  lisinopril. Return in 6 months      Fatigue 06/08/2015   Overview    Last Assessment & Plan:  Formatting of this note is different from the original. New- progressive. >25 minutes spent in face to face time with patient, >50% spent in counselling or coordination of care concerning fatigue and snoring. Start with lab work up and referral for sleep study. The patient indicates understanding of these issues and agrees with the plan. Orders Placed This Encounter  Procedures   Vitamin B12   CBC with Differential/Platelet   Comprehensive metabolic panel   TSH   Lipid panel   Vitamin D, 25-hydroxy   Ambulatory referral to Pulmonology      Snoring 06/08/2015   Body mass index between 19-24, adult 03/31/2015   Overview    Formatting of this note might be different from the original.      Elevated blood-pressure reading without diagnosis of hypertension 11/25/2013   Overview    Last Assessment & Plan:  Normotensive today. Reassuring log. Reassurance provided today. Continue keeping a log. Call or return to clinic prn if these symptoms worsen or fail to improve as anticipated. The patient indicates understanding of these issues and agrees with the plan.      Urinary frequency 11/16/2011   Overview    Last Assessment & Plan:  Formatting of this note might be different from the original. Urine with culture ordered pending culture.       GENERAL REVIEW OF SYSTEMS:   General ROS: negative for - chills, fatigue, fever, weight gain or weight loss Allergy and Immunology ROS: negative for - hives  Hematological and Lymphatic ROS: negative for - bleeding problems or bruising, negative for palpable nodes Endocrine ROS: negative for - heat or cold intolerance, hair changes Respiratory ROS: negative for - cough, shortness of breath or wheezing Cardiovascular ROS: no chest pain or palpitations GI ROS: negative for nausea, vomiting, abdominal pain, diarrhea, constipation Musculoskeletal  ROS: negative for - joint swelling or muscle pain Neurological ROS: negative for - confusion, syncope Dermatological ROS: negative for pruritus and rash Psychiatric: negative for anxiety, depression, difficulty sleeping and memory loss  MEDICATIONS: Current Outpatient Medications  Medication Sig Dispense Refill   BIOTIN ORAL Take by mouth once daily.       coenzyme Q10 10 mg capsule Take 10 mg by mouth once daily.     estradioL (CLIMARA) 0.05 mg/24 hr patch Apply 1 patch topically onto the skin once weekly. 12 patch 0   lisinopriL (ZESTRIL) 20 MG tablet Take 20 mg by mouth once daily     MULTIVITAMIN ORAL Take by mouth once daily.       clobetasoL (CORMAX) 0.05 % external solution APPLY TO THE AFFECTED SCALP AREA TWICE A DAY AVOID FACE, GROIN, AND AXILLAE. (Patient not taking: No sig reported)     cyclobenzaprine (FLEXERIL) 5 MG tablet Take 5 mg by mouth 3 (  three) times daily as needed (Patient not taking: Reported on 09/25/2021)     fluticasone (FLONASE) 50 mcg/actuation nasal spray Place into both nostrils 2 (two) times daily.   (Patient not taking: Reported on 04/21/2020)     hydroCHLOROthiazide (MICROZIDE) 12.5 mg capsule Take 12.5 mg by mouth once daily. (Patient not taking: Reported on 04/21/2020)  3   tacrolimus (PROTOPIC) 0.1 % ointment APPLY A THIN LAYER TO AFFECTED AREA (S) of THE FRONTAL SCALP AT BEDTIME, RUB GENTLY AND COMPLETELY. (Patient not taking: No sig reported)     tolterodine (DETROL LA) 4 MG LA capsule Take 1 capsule (4 mg total) by mouth once daily (Patient not taking: Reported on 04/17/2019) 30 capsule 6   valACYclovir (VALTREX) 1000 MG tablet TAKE 1 TABLET (1,000 MG TOTAL) BY MOUTH 3 (THREE) TIMES DAILY FOR 7 DAYS. (Patient not taking: No sig reported)     No current facility-administered medications for this visit.    ALLERGIES: Patient has no known allergies.  PAST MEDICAL HISTORY: Past Medical History:  Diagnosis Date   Anemia    Bell's palsy    Fibroid uterus     Headache    History of hyperthyroidism 1999   resolved spontaneously   Hypertension    Menorrhagia    Nontoxic goiter    Palpitations     PAST SURGICAL HISTORY: Past Surgical History:  Procedure Laterality Date   ENDOMETRIAL ABLATION W/ NOVASURE  07/23/2008   LSH and left salpingectomy  12/25/2010   hydrosalpinx noted   HYSTERECTOMY  12/25/2010   [facility]   CHOLECYSTECTOMY     LAPAROSCOPIC CHOLECYSTECTOMY     other     surgery for deviated septum     FAMILY HISTORY: Family History  Problem Relation Age of Onset   Cataracts Mother    Thyroid disease Mother        partial thyroidectomy   High blood pressure (Hypertension) Mother    High blood pressure (Hypertension) Father    No Known Problems Sister    Breast cancer Maternal Aunt 35   Glaucoma Neg Hx    Macular degeneration Neg Hx      SOCIAL HISTORY: Social History   Socioeconomic History   Marital status: Married  Occupational History   Occupation: [employer]  Tobacco Use   Smoking status: Never   Smokeless tobacco: Never  Substance and Sexual Activity   Alcohol use: No   Drug use: No   Sexual activity: Yes    Partners: Male    Birth control/protection: Surgical    Comment: LSH    PHYSICAL EXAM: Vitals:   11/24/21 0928  BP: (!) 145/92  Pulse: 80   Body mass index is 23.59 kg/m. Weight: 58.5 kg (129 lb)   GENERAL: Alert, active, oriented x3  HEENT: Pupils equal reactive to light. Extraocular movements are intact. Sclera clear. Palpebral conjunctiva normal red color.Pharynx clear.  NECK: Supple with no palpable mass and no adenopathy.  LUNGS: Sound clear with no rales rhonchi or wheezes.  HEART: Regular rhythm S1 and S2 without murmur.  BREAST: breasts appear normal, no suspicious masses, no skin or nipple changes or axillary nodes.  ABDOMEN: Soft and depressible, nontender with no palpable mass, no hepatomegaly.  EXTREMITIES: Well-developed well-nourished symmetrical with no dependent  edema.  NEUROLOGICAL: Awake alert oriented, facial expression symmetrical, moving all extremities.  REVIEW OF DATA: I have reviewed the following data today: Office Visit on 09/25/2021  Component Date Value   Diagnostic Interpretation 09/25/2021 Comment  Specimen adequacy: - Lab* 09/25/2021 Comment    Clinician provided ICD10* 09/25/2021 Comment    PERFORMED BY: - [facility] 09/25/2021 Comment    . - [facility] 09/25/2021 .    Note: - [facility] 09/25/2021 Comment    Test Method MT21 - [facility] 09/25/2021 Comment    HPV APTIMA - [facility] 09/25/2021 Negative    HPV Genotype Reflex - [facility] 09/25/2021 Comment      ASSESSMENT: Ms. Odriscoll is a 51 y.o. female presenting for consultation for right breast radial scar with atypia.    Patient was oriented again about the pathology results. Surgical alternatives were discussed with patient including radiofrequency tag excisional biopsy. Surgical technique and post operative care was discussed with patient. Risk of surgery was discussed with patient including but not limited to: wound infection, seroma, hematoma, brachial plexopathy, mondor's disease (thrombosis of small veins of breast), chronic wound pain, breast lymphedema, altered sensation to the nipple and cosmesis among others.   I discussed with patient that the finding of radial scar especially with atypia has a high risk of upgrading to malignancy.  My recommendation is to proceed with excisional biopsy instead of observation.  Patient requested to get the excisional biopsy the second week of October because she is moving.  We will schedule her for the second week of October as per patient request.  Radial scar of breast [N64.89]  PLAN: 1.  Right breast radiofrequency tag guided excisional biopsy (19125) 2.  Avoid taking aspirin 5 days before the procedure 3.  Contact us if you have any concern  Patient verbalized understanding, all questions were answered, and were agreeable with the plan  outlined above.     Herbert Pun, MD  Electronically signed by Herbert Pun, MD

## 2021-11-27 ENCOUNTER — Other Ambulatory Visit: Payer: Self-pay | Admitting: General Surgery

## 2021-11-27 DIAGNOSIS — N6489 Other specified disorders of breast: Secondary | ICD-10-CM

## 2021-11-30 ENCOUNTER — Ambulatory Visit: Payer: BC Managed Care – PPO | Admitting: Family Medicine

## 2021-11-30 VITALS — BP 122/80 | HR 75 | Temp 97.6°F | Ht 62.0 in | Wt 129.1 lb

## 2021-11-30 DIAGNOSIS — Z23 Encounter for immunization: Secondary | ICD-10-CM

## 2021-11-30 DIAGNOSIS — M79605 Pain in left leg: Secondary | ICD-10-CM | POA: Insufficient documentation

## 2021-11-30 NOTE — Assessment & Plan Note (Signed)
Reassuring exam, no signs of nerve impingement.  Suspect this is muscle related. Trial of ibuprofen and tylenol. Handout for exercises. PT if not improving.

## 2021-11-30 NOTE — Patient Instructions (Addendum)
Take ibuprofen (400-800 mg) and tylenol (500-1000mg ) up to 3 times daily for the next few days - decrease as able - home stretches - if pain not improving can refer to physical therapy or try chiropractor   Mable Paris - NP at [facility] or Romilda Garret - NP at [facility]

## 2021-11-30 NOTE — Progress Notes (Signed)
Subjective:     Kristin Hunter is a 51 y.o. female presenting for Sciatica (From left glute down leg x 1-2 months)     Back Pain This is a new problem. The current episode started more than 1 month ago. The problem occurs constantly. The problem has been gradually worsening since onset. The pain is present in the gluteal. The pain radiates to the left thigh. The pain is at a severity of 4/10. The pain is mild. The symptoms are aggravated by sitting. Associated symptoms include leg pain. Pertinent negatives include no bladder incontinence, bowel incontinence, fever, numbness, tingling or weakness. She has tried home exercises (improves with standing) for the symptoms. The treatment provided mild relief.    #breast atypia - saw a surgeon - planning excisional biopsy - in October - will get 6 month imaging f/u  Review of Systems  Constitutional:  Negative for fever.  Gastrointestinal:  Negative for bowel incontinence.  Genitourinary:  Negative for bladder incontinence.  Musculoskeletal:  Positive for back pain.  Neurological:  Negative for tingling, weakness and numbness.     Social History   Tobacco Use  Smoking Status Never  Smokeless Tobacco Never        Objective:    BP Readings from Last 3 Encounters:  11/30/21 122/80  09/14/21 (!) 150/80  08/01/21 126/80   Wt Readings from Last 3 Encounters:  11/30/21 129 lb 2 oz (58.6 kg)  09/14/21 129 lb (58.5 kg)  08/01/21 133 lb 8 oz (60.6 kg)    BP 122/80   Pulse 75   Temp 97.6 F (36.4 C) (Temporal)   Ht 5\' 2"  (1.575 m)   Wt 129 lb 2 oz (58.6 kg)   LMP 11/27/2010   SpO2 100%   BMI 23.62 kg/m    Physical Exam Constitutional:      General: She is not in acute distress.    Appearance: She is well-developed. She is not diaphoretic.  HENT:     Right Ear: External ear normal.     Left Ear: External ear normal.     Nose: Nose normal.  Eyes:     Conjunctiva/sclera: Conjunctivae normal.  Cardiovascular:      Rate and Rhythm: Normal rate.  Pulmonary:     Effort: Pulmonary effort is normal.  Musculoskeletal:     Cervical back: Neck supple.     Comments: Back Inspection: no abnormalities Palpation: No spinous process ttp, no Paraspinous ttp ROM: normal flexion, extension, rotation, and lateral flexion Strength: Normal bilateral hip flexion/extension, knee flexion/extension, dorsiflexion/extension Straight leg raise - negative b/l    Skin:    General: Skin is warm and dry.     Capillary Refill: Capillary refill takes less than 2 seconds.  Neurological:     Mental Status: She is alert. Mental status is at baseline.  Psychiatric:        Mood and Affect: Mood normal.        Behavior: Behavior normal.           Assessment & Plan:   Problem List Items Addressed This Visit       Other   Need for influenza vaccination   Relevant Orders   Flu Vaccine QUAD 63mo+IM (Fluarix, Fluzone & Alfiuria Quad PF) (Completed)   Left leg pain - Primary    Reassuring exam, no signs of nerve impingement.  Suspect this is muscle related. Trial of ibuprofen and tylenol. Handout for exercises. PT if not improving.  Return if symptoms worsen or fail to improve.  Lynnda Child, MD

## 2021-12-01 ENCOUNTER — Other Ambulatory Visit: Payer: Self-pay | Admitting: Family Medicine

## 2021-12-01 DIAGNOSIS — I10 Essential (primary) hypertension: Secondary | ICD-10-CM

## 2021-12-10 DIAGNOSIS — I10 Essential (primary) hypertension: Secondary | ICD-10-CM | POA: Diagnosis not present

## 2021-12-13 ENCOUNTER — Other Ambulatory Visit: Payer: Self-pay

## 2021-12-13 ENCOUNTER — Encounter
Admission: RE | Admit: 2021-12-13 | Discharge: 2021-12-13 | Disposition: A | Discharge status: Home / Self Care | Payer: BC Managed Care – PPO | Source: Ambulatory Visit | Attending: General Surgery | Admitting: General Surgery

## 2021-12-13 VITALS — Ht 62.0 in | Wt 135.0 lb

## 2021-12-13 DIAGNOSIS — I10 Essential (primary) hypertension: Secondary | ICD-10-CM

## 2021-12-13 HISTORY — DX: Essential (primary) hypertension: I10

## 2021-12-13 HISTORY — DX: Family history of other specified conditions: Z84.89

## 2021-12-13 NOTE — Patient Instructions (Signed)
Your procedure is scheduled on: 01/05/22 Report to [facility]. To find out your arrival time please call [phone] between 1PM - 3PM on 01/04/22.  Remember: Instructions that are not followed completely may result in serious medical risk, up to and including death, or upon the discretion of your surgeon and anesthesiologist your surgery may need to be rescheduled.     _X__ 1. Do not eat food or drink any liquids after midnight the night before your procedure.                 No gum chewing or hard candies.   __X__2.  On the morning of surgery brush your teeth with toothpaste and water, you                 may rinse your mouth with mouthwash if you wish.  Do not swallow any              toothpaste of mouthwash.     _X__ 3.  No Alcohol for 24 hours before or after surgery.   _X__ 4.  Do Not Smoke or use e-cigarettes For 24 Hours Prior to Your Surgery.                 Do not use any chewable tobacco products for at least 6 hours prior to                 surgery.  ____  5.  Bring all medications with you on the day of surgery if instructed.   __X__  6.  Notify your doctor if there is any change in your medical condition      (cold, fever, infections).     Do not wear jewelry, make-up, hairpins, clips or nail polish. Do not wear lotions, powders, or perfumes. No Deodorant Do not shave body hair 48 hours prior to surgery. Men may shave face and neck. Do not bring valuables to the hospital.    [facility] is not responsible for any belongings or valuables.  Contacts, dentures/partials or body piercings may not be worn into surgery. Bring a case for your contacts, glasses or hearing aids, a denture cup will be supplied. Leave your suitcase in the car. After surgery it may be brought to your room. For patients admitted to the hospital, discharge time is determined by your treatment team.   Patients discharged the day of surgery will  not be allowed to drive home.   Please read over the following fact sheets that you were given:   CHG soap  __X__ Take these medicines the morning of surgery with A SIP OF WATER:    1. none  2.   3.   4.  5.  6.  ____ Fleet Enema (as directed)   __X__ Use CHG Soap/SAGE wipes as directed  ____ Use inhalers on the day of surgery  ____ Stop metformin/Janumet/Farxiga 2 days prior to surgery    ____ Take 1/2 of usual insulin dose the night before surgery. No insulin the morning          of surgery.   ____ Stop Blood Thinners Coumadin/Plavix/Xarelto/Pleta/Pradaxa/Eliquis/Effient/Aspirin  on   Or contact your Surgeon, Cardiologist or Medical Doctor regarding  ability to stop your blood thinners  __X__ Stop Anti-inflammatories 7 days before surgery such as Advil, Ibuprofen, Motrin,  BC or Goodies Powder, Naprosyn, Naproxen, Aleve, Aspirin   You may take Tylenol instead if needed  __X__ Stop all herbals and supplements, fish oil or vitamins for 1 week until after surgery.    ____ Bring C-Pap to the hospital.

## 2021-12-15 ENCOUNTER — Inpatient Hospital Stay: Admission: RE | Admit: 2021-12-15 | Payer: BC Managed Care – PPO | Source: Ambulatory Visit

## 2021-12-19 ENCOUNTER — Inpatient Hospital Stay: Admission: RE | Admit: 2021-12-19 | Payer: BC Managed Care – PPO | Source: Ambulatory Visit

## 2021-12-22 ENCOUNTER — Encounter
Admission: RE | Admit: 2021-12-22 | Discharge: 2021-12-22 | Disposition: A | Discharge status: Home / Self Care | Payer: BC Managed Care – PPO | Source: Ambulatory Visit | Attending: General Surgery | Admitting: General Surgery

## 2021-12-22 ENCOUNTER — Encounter: Payer: Self-pay | Admitting: Family

## 2021-12-22 ENCOUNTER — Ambulatory Visit: Payer: BC Managed Care – PPO | Admitting: Family

## 2021-12-22 VITALS — BP 118/62 | HR 69 | Temp 98.6°F | Resp 16 | Ht 62.0 in | Wt 125.0 lb

## 2021-12-22 DIAGNOSIS — I10 Essential (primary) hypertension: Secondary | ICD-10-CM | POA: Diagnosis not present

## 2021-12-22 DIAGNOSIS — Z01810 Encounter for preprocedural cardiovascular examination: Secondary | ICD-10-CM | POA: Insufficient documentation

## 2021-12-22 DIAGNOSIS — H6123 Impacted cerumen, bilateral: Secondary | ICD-10-CM

## 2021-12-22 DIAGNOSIS — Z01818 Encounter for other preprocedural examination: Secondary | ICD-10-CM | POA: Diagnosis not present

## 2021-12-22 NOTE — Assessment & Plan Note (Signed)
Ceruminosis is noted.  Obtained verbal patient consent prior to procedure, possible risks of procedure discussed with pt prior, and then Wax was removed by syringing/irrigation and manual debridement was performed by me with curette. Instructions for home care to prevent wax buildup are given and handout provided to pt .pt tolerated procedure well.   

## 2021-12-22 NOTE — Patient Instructions (Signed)
  Recommend daily hydrogen peroxide with water, mixed in the cap of the bottle Administer into both ears prior to shower, and then sit for about ten seconds each side then take shower and will drain out.  Don't use q tips.   Due to recent changes in healthcare laws, you may see results of your imaging and/or laboratory studies on MyChart before I have had a chance to review them.  I understand that in some cases there may be results that are confusing or concerning to you. Please understand that not all results are received at the same time and often I may need to interpret multiple results in order to provide you with the best plan of care or course of treatment. Therefore, I ask that you please give me 2 business days to thoroughly review all your results before contacting my office for clarification. Should we see a critical lab result, you will be contacted sooner.   It was a pleasure seeing you today! Please do not hesitate to reach out with any questions and or concerns.  Regards,   Eugenia Pancoast FNP-C

## 2021-12-22 NOTE — Progress Notes (Signed)
Established Patient Office Visit  Subjective:  Patient ID: Kristin Hunter, female    DOB: [birth year]  Age: 51 y.o. MRN: [id number]  CC:  Chief Complaint  Patient presents with  . Ear Fullness    X 2 weeks both ears but right is worse then left    HPI Kristin Hunter is here today with concerns.   Over the last two weeks after a shower both ears feel full. Right >left. Hard to lay her head on pillow right side because it feels really full when she does. Does have h/o impacted cerumen.   No other upper resp symptoms.   Past Medical History:  Diagnosis Date  . COVID 01/2021  . Family history of adverse reaction to anesthesia    mother vomiting off balance  . Headache   . Hypertension   . Hyperthyroidism     Past Surgical History:  Procedure Laterality Date  . ABDOMINAL HYSTERECTOMY    . BREAST BIOPSY Right 11/07/2021   rt br bx distortion, x clip, path pending  . CHOLECYSTECTOMY    . ENDOMETRIAL ABLATION  2010  . NASAL TURBINATE REDUCTION Bilateral 11/02/2016   Procedure: TURBINATE REDUCTION/SUBMUCOSAL RESECTION;  Surgeon: Beverly Gust, MD;  Location: [place];  Service: ENT;  Laterality: Bilateral;  . PARTIAL HYSTERECTOMY    . SEPTOPLASTY Bilateral 11/02/2016   Procedure: SEPTOPLASTY;  Surgeon: Beverly Gust, MD;  Location: [place];  Service: ENT;  Laterality: Bilateral;    Family History  Problem Relation Age of Onset  . Breast cancer Maternal Aunt        late 66's  . Hypertension Mother   . Thyroid disease Mother   . Hypertension Father   . Heart attack Maternal Grandmother 70  . Heart attack Maternal Grandfather 72  . Diabetes Maternal Grandfather     Social History   Socioeconomic History  . Marital status: Married    Spouse name: Kristin Hunter  . Number of children: 2  . Years of education: Grad school  . Highest education level: Not on file  Occupational History  . Not on file  Tobacco Use  . Smoking status: Never  .  Smokeless tobacco: Never  Vaping Use  . Vaping Use: Never used  Substance and Sexual Activity  . Alcohol use: No  . Drug use: No  . Sexual activity: Yes    Birth control/protection: Surgical  Other Topics Concern  . Not on file  Social History Narrative   04/22/19   From: the area   Living: with husband Kristin Hunter    Work: getting an Loss adjuster, chartered currently Event organiser at [employer]: 2 sons - [place] and [place] - both adults      Enjoys: yoga, walking, watching a movie, reading      Exercise: not as good with the weather, yoga 2-3 times per week   Diet: veggies, eating out a lot       Safety   Seat belts: Yes    Guns: Yes  and secure   Safe in relationships: Yes    Social Determinants of Health   Financial Resource Strain: Not on file  Food Insecurity: Not on file  Transportation Needs: Not on file  Physical Activity: Not on file  Stress: Not on file  Social Connections: Not on file  Intimate Partner Violence: Not on file    Outpatient Medications Prior to Visit  Medication Sig Dispense Refill  . Aspirin-Salicylamide-Caffeine (BC HEADACHE POWDER PO)  Take by mouth daily as needed.    Marland Kitchen estradiol (CLIMARA - DOSED IN MG/24 HR) 0.05 mg/24hr patch Place 0.05 mg onto the skin once a week.    Marland Kitchen ibuprofen (ADVIL) 200 MG tablet Take 200 mg by mouth every 6 (six) hours as needed.    Marland Kitchen lisinopril (ZESTRIL) 20 MG tablet Take 1 tablet by mouth daily. 90 tablet 3  . Multiple Vitamin (MULTI-VITAMINS) TABS Take 1 tablet by mouth daily.     No facility-administered medications prior to visit.    No Known Allergies      Objective:    Physical Exam Constitutional:      General: She is not in acute distress.    Appearance: Normal appearance. She is normal weight. She is not ill-appearing, toxic-appearing or diaphoretic.  HENT:     Head:     Comments: No auricle tenderness no pinna tenderness     Right Ear: Tympanic membrane normal. There is impacted cerumen.     Left Ear: Tympanic  membrane normal. There is impacted cerumen.     Nose: Nose normal. No congestion or rhinorrhea.     Right Turbinates: Not enlarged or swollen.     Left Turbinates: Not enlarged or swollen.     Right Sinus: No maxillary sinus tenderness or frontal sinus tenderness.     Left Sinus: No maxillary sinus tenderness or frontal sinus tenderness.     Mouth/Throat:     Mouth: Mucous membranes are moist.     Pharynx: No pharyngeal swelling, oropharyngeal exudate or posterior oropharyngeal erythema.     Tonsils: No tonsillar exudate.  Eyes:     Extraocular Movements: Extraocular movements intact.     Conjunctiva/sclera: Conjunctivae normal.     Pupils: Pupils are equal, round, and reactive to light.  Neck:     Thyroid: No thyroid mass.  Cardiovascular:     Rate and Rhythm: Normal rate and regular rhythm.  Pulmonary:     Effort: Pulmonary effort is normal.     Breath sounds: Normal breath sounds.  Lymphadenopathy:     Cervical:     Right cervical: No superficial cervical adenopathy.    Left cervical: No superficial cervical adenopathy.  Neurological:     Mental Status: She is alert.    BP 118/62   Pulse 69   Temp 98.6 F (37 C)   Resp 16   Ht 5\' 2"  (1.575 m)   Wt 125 lb (56.7 kg)   LMP 11/27/2010   SpO2 99%   BMI 22.86 kg/m  Wt Readings from Last 3 Encounters:  12/22/21 125 lb (56.7 kg)  12/13/21 135 lb (61.2 kg)  11/30/21 129 lb 2 oz (58.6 kg)     Health Maintenance Due  Topic Date Due  . COLONOSCOPY (Pts 45-61yrs Insurance coverage will need to be confirmed)  Never done  . Zoster Vaccines- Shingrix (1 of 2) Never done  . COVID-19 Vaccine (4 - Pfizer series) 04/06/2020  . PAP SMEAR-Modifier  09/08/2021    There are no preventive care reminders to display for this patient.  Lab Results  Component Value Date   TSH 0.70 05/20/2019   Lab Results  Component Value Date   WBC 4.6 04/17/2021   HGB 12.6 04/17/2021   HCT 37.8 04/17/2021   MCV 92.4 04/17/2021   PLT 359.0  04/17/2021   Lab Results  Component Value Date   NA 139 04/17/2021   K 4.0 04/17/2021   CO2 32 04/17/2021   GLUCOSE 79  04/17/2021   BUN 11 04/17/2021   CREATININE 0.89 04/17/2021   BILITOT 0.3 04/17/2021   ALKPHOS 56 04/17/2021   AST 11 04/17/2021   ALT 7 04/17/2021   PROT 7.1 04/17/2021   ALBUMIN 4.1 04/17/2021   CALCIUM 9.2 04/17/2021   GFR 75.19 04/17/2021   Lab Results  Component Value Date   HGBA1C 5.5 12/23/2018      Assessment & Plan:   Problem List Items Addressed This Visit       Nervous and Auditory   Bilateral impacted cerumen - Primary    Ceruminosis is noted.  Obtained verbal patient consent prior to procedure, possible risks of procedure discussed with pt prior, and then Wax was removed by syringing/irrigation and manual debridement was performed by me with curette. Instructions for home care to prevent wax buildup are given and handout provided to pt .pt tolerated procedure well.        No orders of the defined types were placed in this encounter.   Follow-up: No follow-ups on file.    Mort Sawyers, FNP

## 2021-12-25 ENCOUNTER — Telehealth: Payer: Self-pay | Admitting: Urgent Care

## 2021-12-25 NOTE — Progress Notes (Signed)
  Perioperative Services Pre-Admission/Anesthesia Testing    Date: 12/25/21  Name: Kristin Hunter MRN:   [id number]  Re: ECG changes and need for preoperative cardiology evaluation  Planned Surgical Procedure(s):  Procedure: RIGHT BREAST RADIOFREQUENCY TAGGED EXCISIONAL BIOPSY Date of Surgery: 01/05/2022 Surgeon: Herbert Pun, MD  Clinical Notes:  Patient is scheduled for the above procedure on 01/05/2022 with Dr. Herbert Pun, MD. in review of her preoperative ECG, patient with noted ST and T wave abnormalities noted in the inferior and anterolateral leads.  Inferior changes were present to some degree on tracing performed in 11/2017, however they are noted to be more pronounced at this point.  Patient previously followed by cardiology Rockey Situ, MD) for hypertension and was last seen on 11/27/2017.  Patient was on appropriate pharmacological therapy for her hypertension at the time and was scheduled to RTC on a as needed basis.    Preoperative ECG was over read by cardiology on 12/25/2021.  Cardiologist noted that there was no previous ECG for review/comparison.  Reached out to Dr. Rockey Situ to review preoperative ECG with him.  MD reviewed tracing and agreed that inferior and anterolateral changes seem more pronounced as compared to previous tracing.  MD recommending echocardiogram and RTC visit for further evaluation prior to surgery.  Dr. Rockey Situ is going to make efforts to get patient scheduled for echocardiogram prior to her being seen in the office in efforts to expedite things prior to her surgery.  Impression and Plan:  [name] with preoperative ECG changes noted.  I have spoken with cardiology and they are recommending further testing and office visit prior to proceeding with surgery. Surgical pathology from 11/07/2021 reviewed as showing radial scar with atypia.  IHC testing was negative for CK5/6 with retained ER expression suggestive of epithelial atypia. No  definitive malignancy noted, however given the atypical histology, patient at increased risk for malignancy.   I have attempted to call patient to make her aware of the above, however I was unable to reach her.  Left detailed message on machine per DPR direction.  I advised her that would make every effort to not have to change her original surgery date, however depending on needed evaluation and cardiology availability, may have to postpone date.  We will plan on following up with cardiology office tomorrow morning (12/26/2021) to determine when patient can be scheduled for cardiovascular testing and office visit.    Copy of this note being sent to primary attending surgeon to make him aware of ECG changes and need for further cardiovascular testing prior to surgery.  We will plan on keeping surgery practice aware of any needed changes to the planned surgical date.  Again, hopes are that cardiology can complete testing and evaluation without having to delay patient's original surgery date.  Honor Loh, MSN, APRN, FNP-C, CEN [facility]  Peri-operative Services Nurse Practitioner Phone: [phone] 12/25/21 6:05 PM  NOTE: This note has been prepared using Dragon dictation software. Despite my best ability to proofread, there is always the potential that unintentional transcriptional errors may still occur from this process.

## 2021-12-26 ENCOUNTER — Ambulatory Visit
Admission: RE | Admit: 2021-12-26 | Discharge: 2021-12-26 | Disposition: A | Discharge status: Home / Self Care | Payer: BC Managed Care – PPO | Source: Ambulatory Visit | Attending: General Surgery | Admitting: General Surgery

## 2021-12-26 ENCOUNTER — Telehealth: Payer: Self-pay | Admitting: Cardiovascular Disease

## 2021-12-26 DIAGNOSIS — R928 Other abnormal and inconclusive findings on diagnostic imaging of breast: Secondary | ICD-10-CM | POA: Diagnosis not present

## 2021-12-26 DIAGNOSIS — R9431 Abnormal electrocardiogram [ECG] [EKG]: Secondary | ICD-10-CM

## 2021-12-26 DIAGNOSIS — I517 Cardiomegaly: Secondary | ICD-10-CM

## 2021-12-26 DIAGNOSIS — N6489 Other specified disorders of breast: Secondary | ICD-10-CM | POA: Insufficient documentation

## 2021-12-26 DIAGNOSIS — Z01810 Encounter for preprocedural cardiovascular examination: Secondary | ICD-10-CM

## 2021-12-26 NOTE — Telephone Encounter (Signed)
Echo and appointment scheduled.

## 2021-12-26 NOTE — Telephone Encounter (Signed)
LMOV  Needs confirm ECHO and schedule follow up before 10/27

## 2021-12-26 NOTE — Telephone Encounter (Signed)
-----   Message from Minna Merritts, MD sent at 12/25/2021  5:50 PM EDT ----- Contacted by surgical preop This woman is scheduled for breast surgery October 27 Has abnormal EKG Concern for LVH with strain pattern Need to rule out ischemia Can we order echocardiogram for abnormal EKG, LVH and time before her surgery? If possible if she was able to see one of the providers to go over echo to give clearance Thx TGollan

## 2021-12-27 ENCOUNTER — Ambulatory Visit: Payer: BC Managed Care – PPO | Attending: Cardiovascular Disease

## 2021-12-27 DIAGNOSIS — I517 Cardiomegaly: Secondary | ICD-10-CM | POA: Diagnosis not present

## 2021-12-27 DIAGNOSIS — Z01810 Encounter for preprocedural cardiovascular examination: Secondary | ICD-10-CM

## 2021-12-27 DIAGNOSIS — R9431 Abnormal electrocardiogram [ECG] [EKG]: Secondary | ICD-10-CM | POA: Diagnosis not present

## 2021-12-27 LAB — ECHOCARDIOGRAM COMPLETE
AR max vel: 1.77 cm2
AV Area VTI: 2.09 cm2
AV Area mean vel: 1.83 cm2
AV Mean grad: 4 mmHg
AV Peak grad: 7.2 mmHg
Ao pk vel: 1.34 m/s
Area-P 1/2: 3.61 cm2
Calc EF: 56.3 %
S' Lateral: 2.2 cm
Single Plane A2C EF: 56.1 %
Single Plane A4C EF: 55.3 %

## 2021-12-28 ENCOUNTER — Telehealth: Payer: Self-pay | Admitting: Cardiovascular Disease

## 2022-01-01 NOTE — Progress Notes (Signed)
Cardiology Clinic Note   Patient Name: Kristin Hunter Date of Encounter: 01/02/2022  Primary Care Provider:  Waunita Schooner, MD Primary Cardiologist:  None  Patient Profile    51 year old female with a past medical history of essential hypertension, sleep apnea, status post septoplasty, who presents today for follow-up regarding surgical clearance for upcoming breast surgery.  Past Medical History    Past Medical History:  Diagnosis Date   COVID 01/2021   Family history of adverse reaction to anesthesia    mother vomiting off balance   Headache    Hypertension    Hyperthyroidism    Past Surgical History:  Procedure Laterality Date   ABDOMINAL HYSTERECTOMY     BREAST BIOPSY Right 11/07/2021   rt br bx distortion, x clip, path pending   CHOLECYSTECTOMY     ENDOMETRIAL ABLATION  2010   NASAL TURBINATE REDUCTION Bilateral 11/02/2016   Procedure: TURBINATE REDUCTION/SUBMUCOSAL RESECTION;  Surgeon: Beverly Gust, MD;  Location: [place];  Service: ENT;  Laterality: Bilateral;   PARTIAL HYSTERECTOMY     SEPTOPLASTY Bilateral 11/02/2016   Procedure: SEPTOPLASTY;  Surgeon: Beverly Gust, MD;  Location: [place];  Service: ENT;  Laterality: Bilateral;    Allergies  No Known Allergies  History of Present Illness    Kristin Hunter is a 51 year old female with past medical history of essential hypertension, sleep apnea, prior history of septoplasty, who requires follow-up for upcoming right breast radiofrequency tag excisional biopsy.  She was last seen in clinic 11/27/2017 by Dr. Rockey Situ for evaluation of hypertension.  Long discussion was had on lifestyle modifications and periodically taking HCTZ.  She did have an echocardiogram that was completed on 12/27/2021 which revealed LVEF 60 to 65%, no regional wall motion abnormalities, and no valvular abnormalities that were noted.  She returns to clinic today requiring clearance after having an  abnormal EKG prior to her right breast biopsy.  She is doing well with no complaints of chest pain, shortness of breath, palpitations, or lower extremity edema.  She is not as active as she once was walking for exercise approximately 1 time a week.  She is encouraged to increase that to 3 times a week as tolerated.  She is scheduled to undergo right breast biopsy next week.  This is for abnormal cells that are not cancerous but could lead to cancer so it was advised to have the cells removed.   Home Medications    Current Outpatient Medications  Medication Sig Dispense Refill   Aspirin-Salicylamide-Caffeine (BC HEADACHE POWDER PO) Take by mouth daily as needed.     estradiol (CLIMARA - DOSED IN MG/24 HR) 0.05 mg/24hr patch Place 0.05 mg onto the skin once a week.     ibuprofen (ADVIL) 200 MG tablet Take 200 mg by mouth every 6 (six) hours as needed.     lisinopril (ZESTRIL) 20 MG tablet Take 1 tablet by mouth daily. 90 tablet 3   Multiple Vitamin (MULTI-VITAMINS) TABS Take 1 tablet by mouth daily.     No current facility-administered medications for this visit.     Family History    Family History  Problem Relation Age of Onset   Breast cancer Maternal Aunt        late 10's   Hypertension Mother    Thyroid disease Mother    Hypertension Father    Heart attack Maternal Grandmother 41   Heart attack Maternal Grandfather 72   Diabetes Maternal Grandfather  She indicated that her mother is alive. She indicated that her father is alive. She indicated that her sister is alive. She indicated that her maternal grandmother is deceased. She indicated that her maternal grandfather is deceased. She indicated that her paternal grandmother is deceased. She indicated that her paternal grandfather is deceased. She indicated that her maternal aunt is alive.  Social History    Social History   Socioeconomic History   Marital status: Married    Spouse name: [name]   Number of children: 2    Years of education: Grad school   Highest education level: Not on file  Occupational History   Not on file  Tobacco Use   Smoking status: Never   Smokeless tobacco: Never  Vaping Use   Vaping Use: Never used  Substance and Sexual Activity   Alcohol use: No   Drug use: No   Sexual activity: Yes    Birth control/protection: Surgical  Other Topics Concern   Not on file  Social History Narrative   04/22/19   From: the area   Living: with husband [name]    Work: getting an Set designer currently Firefighter at [employer]      Family: 2 sons - [place] and [place] - both adults      Enjoys: yoga, walking, watching a movie, reading      Exercise: not as good with the weather, yoga 2-3 times per week   Diet: veggies, eating out a lot       Safety   Seat belts: Yes    Guns: Yes  and secure   Safe in relationships: Yes    Social Determinants of Health   Financial Resource Strain: Not on file  Food Insecurity: Not on file  Transportation Needs: Not on file  Physical Activity: Not on file  Stress: Not on file  Social Connections: Not on file  Intimate Partner Violence: Not on file     Review of Systems    General:  No chills, fever, night sweats or weight changes.  Cardiovascular:  No chest pain, dyspnea on exertion, edema, orthopnea, palpitations, paroxysmal nocturnal dyspnea. Dermatological: No rash, lesions/masses Respiratory: No cough, dyspnea Urologic: No hematuria, dysuria Abdominal:   No nausea, vomiting, diarrhea, bright red blood per rectum, melena, or hematemesis Neurologic:  No visual changes, wkns, changes in mental status. All other systems reviewed and are otherwise negative except as noted above.     Physical Exam    VS:  BP 136/80 (BP Location: Left Arm, Patient Position: Sitting, Cuff Size: Normal)   Pulse 76   Ht 5\' 2"  (1.575 m)   Wt 128 lb (58.1 kg)   LMP 11/27/2010   SpO2 99%   BMI 23.41 kg/m  , BMI Body mass index is 23.41 kg/m.     GEN: Well nourished, well  developed, in no acute distress. HEENT: normal. Neck: Supple, no JVD, carotid bruits, or masses. Cardiac: RRR, no murmurs, rubs, or gallops. No clubbing, cyanosis, edema.  Radials/DP/PT 2+ and equal bilaterally.  Respiratory:  Respirations regular and unlabored, clear to auscultation bilaterally. GI: Soft, nontender, nondistended, BS + x 4. MS: no deformity or atrophy. Skin: warm and dry, no rash. Neuro:  Strength and sensation are intact. Psych: Normal affect.  Accessory Clinical Findings     Lab Results  Component Value Date   WBC 4.6 04/17/2021   HGB 12.6 04/17/2021   HCT 37.8 04/17/2021   MCV 92.4 04/17/2021   PLT 359.0 04/17/2021   Lab  Results  Component Value Date   CREATININE 0.89 04/17/2021   BUN 11 04/17/2021   NA 139 04/17/2021   K 4.0 04/17/2021   CL 104 04/17/2021   CO2 32 04/17/2021   Lab Results  Component Value Date   ALT 7 04/17/2021   AST 11 04/17/2021   ALKPHOS 56 04/17/2021   BILITOT 0.3 04/17/2021   Lab Results  Component Value Date   CHOL 144 09/13/2020   HDL 45.00 09/13/2020   LDLCALC 63 09/13/2020   LDLDIRECT 63.0 08/12/2017   TRIG 180.0 (H) 09/13/2020   CHOLHDL 3 09/13/2020    Lab Results  Component Value Date   HGBA1C 5.5 12/23/2018    Assessment & Plan   Hypertension.  BP today 150/86 at intake.  Repeat 136/80.  Patient states she was running late rush to get into the office.  She reports that her blood pressure is normally within normal limits.  She takes lisinopril 20 mg daily which is managed by her PCP. Abnormal EKG. Reviewed EKG from 12/22/2021. There are no concerns for ischemia.  Preop cardiovascular exam.  Patient underwent echocardiogram following an abnormal EKG during preoperative exam.  Echo showed EF 60 to 65%.  Normal right and left ventricular size and function.  No indication of elevated cardiac pressures or significant valvular heart disease.    Ms. Hilligoss perioperative risk of a major cardiac event is 0.4%  according to the Revised Cardiac Risk Index (RCRI).  Therefore, she is at low risk for perioperative complications.   Her functional capacity is good at 9.89 METs according to the [facility] Activity Status Index (DASI). Recommendations: According to ACC/AHA guidelines, no further cardiovascular testing needed.  The patient may proceed to surgery at acceptable risk.   Antiplatelet and/or Anticoagulation Recommendations: Patient not currently on antiplatelet/anticoagulation.      Disposition: Patient is cleared from a cardiac standpoint to undergo breast biopsy.  Return as needed.  Carlos Levering, NP 01/02/2022, 11:55 AM

## 2022-01-02 ENCOUNTER — Ambulatory Visit: Payer: BC Managed Care – PPO | Attending: Cardiology | Admitting: Student

## 2022-01-02 ENCOUNTER — Encounter: Payer: Self-pay | Admitting: Cardiology

## 2022-01-02 VITALS — BP 136/80 | HR 76 | Ht 62.0 in | Wt 128.0 lb

## 2022-01-02 DIAGNOSIS — I10 Essential (primary) hypertension: Secondary | ICD-10-CM

## 2022-01-02 DIAGNOSIS — Z01810 Encounter for preprocedural cardiovascular examination: Secondary | ICD-10-CM | POA: Diagnosis not present

## 2022-01-02 DIAGNOSIS — R9431 Abnormal electrocardiogram [ECG] [EKG]: Secondary | ICD-10-CM | POA: Diagnosis not present

## 2022-01-02 NOTE — Progress Notes (Signed)
Perioperative Services Pre-Admission/Anesthesia Testing    Date: 01/02/22  Name: Kristin Hunter MRN:   [id number]  Re: Preoperative cardiovascular evaluation  Planned Surgical Procedure(s):    Case: [id number] Date/Time: 01/05/22 0915   Procedure: BREAST BIOPSY WITH RADIO FREQUENCY LOCALIZER (Right)   Anesthesia type: General   Pre-op diagnosis: N64.89 Radial scar of breast   Location: [facility] OR ROOM 07 / [place] [facility]   Surgeons: Herbert Pun, MD   Clinical Notes:  Patient is scheduled for the above procedure on 01/05/2022 with Dr. Herbert Pun, MD.  Again in preparation for patient's procedure, patient presented to the PAT clinic on 09/24/2021 for preoperative testing.  In review of her preoperative ECG, there were noted changes to her ECG when compared to previous obtained tracing performed in 11/2017.  Patient previously seen in consult by cardiology Rockey Situ, MD); last visit 11/27/2017.  Preoperative ECG reviewed with cardiologist.  MD with concerns for progression of noted inferior and anterolateral ST/T wave changes. Cardiology recommended preoperative echocardiogram and RTC visit for further evaluation and assessment.  TTE was performed on 12/27/2021 revealing normal left trickle systolic function with an EF of 60-65%.  There were no regional wall motion abnormalities.  Right ventricular size and function normal.  There was no evidence of valvular regurgitation.  There was no evidence of a significant transvalvular gradient to suggest stenosis.  Patient was seen and follow-up by cardiology on 01/02/2022; notes reviewed.  At the time of her clinic visit, patient doing well overall from a cardiovascular perspective.  She denied any episodes of chest pain, shortness breath, PND, orthopnea, palpitations, significant peripheral edema, vertiginous symptoms, or presyncope/syncope.  Blood pressure was reasonably controlled at 136/80 mmHg on currently prescribed  ACEi (lisinopril) monotherapy.  Patient was not on any type of lipid-lowering therapies for ASCVD prevention.  She is not diabetic. Patient does not have an OSAH diagnosis. Functional capacity, as defined by DASI, is documented as being >/= 4 METS.  No changes were made to her medication regimen.  Patient to follow-up with cardiology on a as needed basis.  Impression and Plan:  [name] with ECG changes noted on PAT tracing.  Cardiology recommended repeat noninvasive cardiovascular study via TTE.  Study was performed and then normal.  Patient virtually asymptomatic; no angina/anginal equivalent symptoms.  Per cardiology, "perioperative risk of MAC is 0.4% according to the RCRI.  Her functional capacity is good at 9.89 METS according to the DASI.  Therefore, according to ACC/AHA guidelines, no further cardiovascular testing is required at this time.  Patient may proceed to surgery at an overall LOW ACCEPTABLE risk of significant perioperative cardiovascular complications".  Barring any significant acute changes in the patient's overall condition, it is anticipated that she will be able to proceed with the planned surgical intervention. Any acute changes in clinical condition may necessitate her procedure being postponed and/or cancelled. Patient will meet with anesthesia team (MD and/or CRNA) on this day of her procedure for preoperative evaluation/assessment.   Pre-surgical instructions were reviewed with the patient during her PAT appointment and questions were fielded by PAT clinical staff. Patient was advised that if any questions or concerns arise prior to her procedure then she should return a call to PAT and/or her surgeon's office to discuss.  Honor Loh, MSN, APRN, FNP-C, CEN [facility]  Peri-operative Services Nurse Practitioner Phone: [phone] 01/02/22 3:40 PM  NOTE: This note has been prepared using Dragon dictation software. Despite my best ability to  proofread, there is always the potential that unintentional transcriptional errors may still occur from this process

## 2022-01-02 NOTE — Patient Instructions (Signed)
  Medication Instructions:  No changes at this time.   *If you need a refill on your cardiac medications before your next appointment, please call your pharmacy*   Lab Work: None  If you have labs (blood work) drawn today and your tests are completely normal, you will receive your results only by: [place] (if you have MyChart) OR A paper copy in the mail If you have any lab test that is abnormal or we need to change your treatment, we will call you to review the results.   Testing/Procedures: None   Follow-Up: At [facility], you and your health needs are our priority.  As part of our continuing mission to provide you with exceptional heart care, we have created designated Provider Care Teams.  These Care Teams include your primary Cardiologist (physician) and Advanced Practice Providers (APPs -  Physician Assistants and Nurse Practitioners) who all work together to provide you with the care you need, when you need it.  Your next appointment:   As needed.       Important Information About [place]

## 2022-01-05 ENCOUNTER — Ambulatory Visit
Admission: RE | Admit: 2022-01-05 | Discharge: 2022-01-05 | Disposition: A | Discharge status: Home / Self Care | Payer: BC Managed Care – PPO | Attending: General Surgery | Admitting: General Surgery

## 2022-01-05 ENCOUNTER — Ambulatory Visit
Admission: RE | Admit: 2022-01-05 | Discharge: 2022-01-05 | Disposition: A | Discharge status: Home / Self Care | Payer: BC Managed Care – PPO | Source: Ambulatory Visit | Attending: General Surgery | Admitting: General Surgery

## 2022-01-05 ENCOUNTER — Ambulatory Visit: Payer: BC Managed Care – PPO | Admitting: Urgent Care

## 2022-01-05 ENCOUNTER — Encounter
Admission: RE | Disposition: A | Discharge status: Home / Self Care | Payer: Self-pay | Source: Home / Self Care | Attending: General Surgery

## 2022-01-05 ENCOUNTER — Encounter: Payer: Self-pay | Admitting: General Surgery

## 2022-01-05 ENCOUNTER — Other Ambulatory Visit: Payer: Self-pay

## 2022-01-05 DIAGNOSIS — N6489 Other specified disorders of breast: Secondary | ICD-10-CM

## 2022-01-05 DIAGNOSIS — I10 Essential (primary) hypertension: Secondary | ICD-10-CM | POA: Insufficient documentation

## 2022-01-05 DIAGNOSIS — E039 Hypothyroidism, unspecified: Secondary | ICD-10-CM | POA: Diagnosis not present

## 2022-01-05 DIAGNOSIS — N6310 Unspecified lump in the right breast, unspecified quadrant: Secondary | ICD-10-CM | POA: Diagnosis not present

## 2022-01-05 DIAGNOSIS — E0590 Thyrotoxicosis, unspecified without thyrotoxic crisis or storm: Secondary | ICD-10-CM | POA: Insufficient documentation

## 2022-01-05 DIAGNOSIS — N6081 Other benign mammary dysplasias of right breast: Secondary | ICD-10-CM | POA: Diagnosis not present

## 2022-01-05 DIAGNOSIS — L905 Scar conditions and fibrosis of skin: Secondary | ICD-10-CM | POA: Insufficient documentation

## 2022-01-05 DIAGNOSIS — R928 Other abnormal and inconclusive findings on diagnostic imaging of breast: Secondary | ICD-10-CM | POA: Diagnosis not present

## 2022-01-05 HISTORY — PX: BREAST BIOPSY WITH RADIO FREQUENCY LOCALIZER: SHX6895

## 2022-01-05 SURGERY — BREAST BIOPSY WITH RADIO FREQUENCY LOCALIZER
Anesthesia: General | Site: Breast | Laterality: Right

## 2022-01-05 MED ORDER — PROPOFOL 10 MG/ML IV BOLUS
INTRAVENOUS | Status: DC | PRN
  Administered 2022-01-05: 150 mg via INTRAVENOUS

## 2022-01-05 MED ORDER — FAMOTIDINE 20 MG PO TABS: 20.0000 mg | ORAL_TABLET | Freq: Once | ORAL | Status: AC

## 2022-01-05 MED ORDER — CEFAZOLIN SODIUM-DEXTROSE 2-4 GM/100ML-% IV SOLN
INTRAVENOUS | Status: AC
  Filled 2022-01-05: qty 100

## 2022-01-05 MED ORDER — OXYCODONE HCL 5 MG PO TABS: 5.0000 mg | ORAL_TABLET | Freq: Once | ORAL | Status: DC | PRN

## 2022-01-05 MED ORDER — FENTANYL CITRATE (PF) 100 MCG/2ML IJ SOLN
INTRAMUSCULAR | Status: DC | PRN
  Administered 2022-01-05: 50 ug via INTRAVENOUS

## 2022-01-05 MED ORDER — DEXAMETHASONE SODIUM PHOSPHATE 10 MG/ML IJ SOLN
INTRAMUSCULAR | Status: DC | PRN
  Administered 2022-01-05: 10 mg via INTRAVENOUS

## 2022-01-05 MED ORDER — CHLORHEXIDINE GLUCONATE 0.12 % MT SOLN
OROMUCOSAL | Status: AC
  Administered 2022-01-05: 15 mL via OROMUCOSAL
  Filled 2022-01-05: qty 15

## 2022-01-05 MED ORDER — LIDOCAINE HCL (CARDIAC) PF 100 MG/5ML IV SOSY
PREFILLED_SYRINGE | INTRAVENOUS | Status: DC | PRN
  Administered 2022-01-05: 50 mg via INTRAVENOUS

## 2022-01-05 MED ORDER — MIDAZOLAM HCL 2 MG/2ML IJ SOLN
INTRAMUSCULAR | Status: AC
  Filled 2022-01-05: qty 2

## 2022-01-05 MED ORDER — OXYCODONE HCL 5 MG/5ML PO SOLN: 5.0000 mg | Freq: Once | ORAL | Status: DC | PRN

## 2022-01-05 MED ORDER — FENTANYL CITRATE (PF) 100 MCG/2ML IJ SOLN
INTRAMUSCULAR | Status: AC
  Filled 2022-01-05: qty 2

## 2022-01-05 MED ORDER — BUPIVACAINE-EPINEPHRINE (PF) 0.5% -1:200000 IJ SOLN
INTRAMUSCULAR | Status: DC | PRN
  Administered 2022-01-05: 30 mL

## 2022-01-05 MED ORDER — FAMOTIDINE 20 MG PO TABS
ORAL_TABLET | ORAL | Status: AC
  Administered 2022-01-05: 20 mg via ORAL
  Filled 2022-01-05: qty 1

## 2022-01-05 MED ORDER — PROMETHAZINE HCL 25 MG/ML IJ SOLN: 6.2500 mg | INTRAMUSCULAR | Status: DC | PRN

## 2022-01-05 MED ORDER — BUPIVACAINE-EPINEPHRINE (PF) 0.5% -1:200000 IJ SOLN
INTRAMUSCULAR | Status: AC
  Filled 2022-01-05: qty 30

## 2022-01-05 MED ORDER — CEFAZOLIN SODIUM-DEXTROSE 2-4 GM/100ML-% IV SOLN
2.0000 g | INTRAVENOUS | Status: AC
  Administered 2022-01-05: 2 g via INTRAVENOUS

## 2022-01-05 MED ORDER — ONDANSETRON HCL 4 MG/2ML IJ SOLN
INTRAMUSCULAR | Status: DC | PRN
  Administered 2022-01-05: 4 mg via INTRAVENOUS

## 2022-01-05 MED ORDER — ACETAMINOPHEN 10 MG/ML IV SOLN: 1000.0000 mg | Freq: Once | INTRAVENOUS | Status: DC | PRN

## 2022-01-05 MED ORDER — ORAL CARE MOUTH RINSE: 15.0000 mL | Freq: Once | OROMUCOSAL | Status: AC

## 2022-01-05 MED ORDER — FENTANYL CITRATE (PF) 100 MCG/2ML IJ SOLN: 25.0000 ug | INTRAMUSCULAR | Status: DC | PRN

## 2022-01-05 MED ORDER — CHLORHEXIDINE GLUCONATE 0.12 % MT SOLN: 15.0000 mL | Freq: Once | OROMUCOSAL | Status: AC

## 2022-01-05 MED ORDER — LACTATED RINGERS IV SOLN: INTRAVENOUS | Status: DC | PRN

## 2022-01-05 MED ORDER — DROPERIDOL 2.5 MG/ML IJ SOLN: 0.6250 mg | Freq: Once | INTRAMUSCULAR | Status: DC | PRN

## 2022-01-05 MED ORDER — PHENYLEPHRINE HCL (PRESSORS) 10 MG/ML IV SOLN
INTRAVENOUS | Status: DC | PRN
  Administered 2022-01-05 (×2): 160 ug via INTRAVENOUS
  Administered 2022-01-05: 240 ug via INTRAVENOUS
  Administered 2022-01-05: 160 ug via INTRAVENOUS

## 2022-01-05 MED ORDER — MIDAZOLAM HCL 2 MG/2ML IJ SOLN
INTRAMUSCULAR | Status: DC | PRN
  Administered 2022-01-05: 2 mg via INTRAVENOUS

## 2022-01-05 MED ORDER — HYDROCODONE-ACETAMINOPHEN 5-325 MG PO TABS: 1.0000 | ORAL_TABLET | ORAL | 0 refills | Status: AC | PRN

## 2022-01-05 MED ORDER — LACTATED RINGERS IV SOLN: INTRAVENOUS | Status: DC

## 2022-01-05 MED ORDER — EPHEDRINE SULFATE (PRESSORS) 50 MG/ML IJ SOLN
INTRAMUSCULAR | Status: DC | PRN
  Administered 2022-01-05: 10 mg via INTRAVENOUS

## 2022-01-05 SURGICAL SUPPLY — 44 items
ADH SKN CLS APL DERMABOND .7 (GAUZE/BANDAGES/DRESSINGS) ×1
APL PRP STRL LF DISP 70% ISPRP (MISCELLANEOUS) ×1
BLADE SURG 15 STRL LF DISP TIS (BLADE) ×1 IMPLANT
BLADE SURG 15 STRL SS (BLADE) ×1
CHLORAPREP W/TINT 26 (MISCELLANEOUS) ×1 IMPLANT
CNTNR SPEC 2.5X3XGRAD LEK (MISCELLANEOUS)
CONT SPEC 4OZ STER OR WHT (MISCELLANEOUS)
CONT SPEC 4OZ STRL OR WHT (MISCELLANEOUS)
CONTAINER SPEC 2.5X3XGRAD LEK (MISCELLANEOUS) ×1 IMPLANT
DERMABOND ADVANCED .7 DNX12 (GAUZE/BANDAGES/DRESSINGS) ×1 IMPLANT
DEVICE DUBIN SPECIMEN MAMMOGRA (MISCELLANEOUS) ×1 IMPLANT
DRAPE LAPAROTOMY TRNSV 106X77 (MISCELLANEOUS) ×1 IMPLANT
ELECT CAUTERY BLADE TIP 2.5 (TIP) ×1
ELECT REM PT RETURN 9FT ADLT (ELECTROSURGICAL) ×1
ELECTRODE CAUTERY BLDE TIP 2.5 (TIP) ×1 IMPLANT
ELECTRODE REM PT RTRN 9FT ADLT (ELECTROSURGICAL) ×1 IMPLANT
GAUZE 4X4 16PLY ~~LOC~~+RFID DBL (SPONGE) ×1 IMPLANT
GLOVE BIO SURGEON STRL SZ 6.5 (GLOVE) ×1 IMPLANT
GLOVE BIOGEL PI IND STRL 6.5 (GLOVE) ×1 IMPLANT
GOWN STRL REUS W/ TWL LRG LVL3 (GOWN DISPOSABLE) ×3 IMPLANT
GOWN STRL REUS W/TWL LRG LVL3 (GOWN DISPOSABLE) ×3
KIT MARKER MARGIN INK (KITS) IMPLANT
KIT TURNOVER KIT A (KITS) ×1 IMPLANT
LABEL OR SOLS (LABEL) ×1 IMPLANT
MANIFOLD NEPTUNE II (INSTRUMENTS) ×1 IMPLANT
MARKER MARGIN CORRECT CLIP (MARKER) IMPLANT
NDL HYPO 25X1 1.5 SAFETY (NEEDLE) ×1 IMPLANT
NEEDLE HYPO 25X1 1.5 SAFETY (NEEDLE) ×1 IMPLANT
PACK BASIN MINOR ARMC (MISCELLANEOUS) ×1 IMPLANT
RETRACTOR RING XSMALL (MISCELLANEOUS) IMPLANT
RTRCTR WOUND ALEXIS 13CM XS SH (MISCELLANEOUS) ×1
SET LOCALIZER 20 PROBE US (MISCELLANEOUS) ×1 IMPLANT
SUT MNCRL 4-0 (SUTURE) ×1
SUT MNCRL 4-0 27XMFL (SUTURE) ×1
SUT SILK 2 0 SH (SUTURE) ×1 IMPLANT
SUT VIC AB 3-0 SH 27 (SUTURE) ×1
SUT VIC AB 3-0 SH 27X BRD (SUTURE) ×1 IMPLANT
SUTURE MNCRL 4-0 27XMF (SUTURE) ×1 IMPLANT
SYR 10ML LL (SYRINGE) ×1 IMPLANT
SYR BULB IRRIG 60ML STRL (SYRINGE) ×1 IMPLANT
TRAP FLUID SMOKE EVACUATOR (MISCELLANEOUS) ×1 IMPLANT
TRAP NEPTUNE SPECIMEN COLLECT (MISCELLANEOUS) ×1 IMPLANT
WATER STERILE IRR 1000ML POUR (IV SOLUTION) ×1 IMPLANT
WATER STERILE IRR 500ML POUR (IV SOLUTION) ×1 IMPLANT

## 2022-01-05 NOTE — Op Note (Signed)
Preoperative diagnosis: Right breast radial scar with atypia  Postoperative diagnosis: Same.   Procedure: Right radiofrequency tag-localized excisional biopsy                      Anesthesia: GETA  Surgeon: Dr. Windell Moment  Wound Classification: Clean  Indications: Patient is a 51 y.o. female with a nonpalpable right breast mass noted on mammography with core biopsy demonstrating radial scar with atypia requires radiofrequency tag-localized partial mastectomy for treatment with sentinel lymph node biopsy.   Findings: 1. Specimen mammography shows marker and tag on specimen 2. No other palpable mass or lymph node identified.   Description of procedure: Preoperative radiofrequency tag localization was performed by radiology. Localization studies were reviewed. The patient was taken to the operating room and placed supine on the operating table, and after general anesthesia the right chest and axilla were prepped and draped in the usual sterile fashion. A time-out was completed verifying correct patient, procedure, site, positioning, and implant(s) and/or special equipment prior to beginning this procedure.  By comparing the localization studies and interrogation with Localizer device, the probable trajectory and location of the mass was visualized. A circumareolar skin incision was planned in such a way as to minimize the amount of dissection to reach the mass.  The skin incision was made. Flaps were raised and the location of the tag was confirmed with Localizer device confirmed. A 2-0 silk figure-of-eight stay suture was placed and used for retraction. Dissection was then taken down circumferentially, taking care to include the entire localizing tag and a wide margin of grossly normal tissue. The specimen and entire localizing tag were removed. The specimen was oriented and sent to radiology with the localization studies. Confirmation was received that the entire target lesion had been  resected. The wound was irrigated. Hemostasis was checked. The wound was closed with interrupted sutures of 3-0 Vicryl and a subcuticular suture of Monocryl 3-0. No attempt was made to close the dead space.   Specimen: Right Breast excisional biopsy                      Complications: None  Estimated Blood Loss: 5 mL

## 2022-01-05 NOTE — H&P (Signed)
PATIENT PROFILE: Kristin Hunter is a 52 y.o. female who presents to the Clinic for consultation at the request of Dr. Leafy Ro for evaluation of right breast radial scar.  PCP: [place], [place] PRESENT ILLNESS: Kristin Hunter reports she had a usual screening mammogram this year and she was found with distortion of the right breast. No normality in the left breast. Patient denies any palpable masses. She has had chronic left breast pain. She denies any nipple discharge. Due to the distortion of the screening mammogram on the right breast, she had diagnostic mammogram and showing persistent distortion. This led to core biopsy of the right breast. This shows right breast radial scar with atypia.  Family history of breast cancer: On from other site Family history of other cancers: None Menarche: 12 Menopause: 73 due to partial hysterectomy for fibroids Used estrogen and progesterone therapy: Estradiol History of Radiation to the chest: Denies Number of pregnancies: 2 Age for pregnancy: 22 No previous breast biopsy  PROBLEM LIST: Problem List Date Reviewed: 08/22/2020  Noted Muscle strain 09/14/2021 Overview Last Assessment & Plan: Formatting of this note might be different from the original. Given patient's history and exam most likely a muscle strain. She can continue using the Salonpas patch, heat, methocarbamol, and 1 NSAID medication. Patient has been using ibuprofen over-the-counter did tell her either use diclofenac or ibuprofen but not both. Defer imaging for now. Follow-up if no improvement   Bilateral impacted cerumen 08/01/2021 Overview Last Assessment & Plan: Formatting of this note might be different from the original. Due to current sinus infection, advised waiting for cerumen removal due to increase risk for side effects. Advised OTC drops and return if persisting   Muscle spasm of back 04/18/2021 Overview Last Assessment & Plan: Formatting of this  note might be different from the original. Suspected spasm, rx flexeril, may cause sedation so only take when able to sleep and or not driving or working. Heat/ice to site prn. Ibuprofen prn.   Epigastric pain 04/17/2021 Overview Last Assessment & Plan: Formatting of this note might be different from the original. Order Diflucan and H. pylori. Also ordered amylase and lipase for abdominal work-up. If all these are negative will consider abdominal ultrasound.   Heartburn 04/22/2019 Overview Last Assessment & Plan: Formatting of this note might be different from the original. Suspect epigastric fullness is heartburn related. Life style changes first. Omeprazole if no improvement. If not improving will consider h pylori testing and or GI referral   Left-sided Bell's palsy 02/20/2019 Overview Last Assessment & Plan: Formatting of this note might be different from the original. Very mild case- she seems to be able to close her eye fully, no facial droop, normal sensation.  Has already seen her eye doctor. Use Natural Tears to keep eye moist during day.  Brush your teeth 3 times a day after meals. Perform the manual exercises for the right facial muscles as many times a day as possible.  CBC, Fasting glucose, lyme titer, rheumatoid factor, HIV, ANA, ESR, ACE, VDRL  Prednisone 60 mg every morning with food, Valtrex 1000 mg three times daily x 1 week.   Well woman exam without gynecological exam 12/23/2018 Overview Last Assessment & Plan: Formatting of this note might be different from the original. Reviewed preventive care protocols, scheduled due services, and updated immunizations Discussed nutrition, exercise, diet, and healthy lifestyle.   HTN (hypertension) 06/27/2016 Overview Last Assessment & Plan: Formatting of this note might be different from  the original. BP at goal. Continue lisinopril. Return in 6 months   Fatigue 06/08/2015 Overview Last Assessment &  Plan: Formatting of this note is different from the original. New- progressive. >25 minutes spent in face to face time with patient, >50% spent in counselling or coordination of care concerning fatigue and snoring. Start with lab work up and referral for sleep study. The patient indicates understanding of these issues and agrees with the plan. Orders Placed This Encounter Procedures Vitamin B12 CBC with Differential/Platelet Comprehensive metabolic panel TSH Lipid panel Vitamin D, 25-hydroxy Ambulatory referral to Pulmonology   Snoring 06/08/2015 Body mass index between 19-24, adult 03/31/2015 Overview Formatting of this note might be different from the original.   Elevated blood-pressure reading without diagnosis of hypertension 11/25/2013 Overview Last Assessment & Plan: Normotensive today. Reassuring log. Reassurance provided today. Continue keeping a log. Call or return to clinic prn if these symptoms worsen or fail to improve as anticipated. The patient indicates understanding of these issues and agrees with the plan.   Urinary frequency 11/16/2011 Overview Last Assessment & Plan: Formatting of this note might be different from the original. Urine with culture ordered pending culture.    GENERAL REVIEW OF SYSTEMS:  General ROS: negative for - chills, fatigue, fever, weight gain or weight loss Allergy and Immunology ROS: negative for - hives Hematological and Lymphatic ROS: negative for - bleeding problems or bruising, negative for palpable nodes Endocrine ROS: negative for - heat or cold intolerance, hair changes Respiratory ROS: negative for - cough, shortness of breath or wheezing Cardiovascular ROS: no chest pain or palpitations GI ROS: negative for nausea, vomiting, abdominal pain, diarrhea, constipation Musculoskeletal ROS: negative for - joint swelling or muscle pain Neurological ROS: negative for - confusion, syncope Dermatological ROS: negative for  pruritus and rash Psychiatric: negative for anxiety, depression, difficulty sleeping and memory loss  MEDICATIONS: Current Outpatient Medications Medication Sig Dispense Refill BIOTIN ORAL Take by mouth once daily.  coenzyme Q10 10 mg capsule Take 10 mg by mouth once daily. estradioL (CLIMARA) 0.05 mg/24 hr patch Apply 1 patch topically onto the skin once weekly. 12 patch 0 lisinopriL (ZESTRIL) 20 MG tablet Take 20 mg by mouth once daily MULTIVITAMIN ORAL Take by mouth once daily.  clobetasoL (CORMAX) 0.05 % external solution APPLY TO THE AFFECTED SCALP AREA TWICE A DAY AVOID FACE, GROIN, AND AXILLAE. (Patient not taking: No sig reported) cyclobenzaprine (FLEXERIL) 5 MG tablet Take 5 mg by mouth 3 (three) times daily as needed (Patient not taking: Reported on 09/25/2021) fluticasone (FLONASE) 50 mcg/actuation nasal spray Place into both nostrils 2 (two) times daily. (Patient not taking: Reported on 04/21/2020) hydroCHLOROthiazide (MICROZIDE) 12.5 mg capsule Take 12.5 mg by mouth once daily. (Patient not taking: Reported on 04/21/2020) 3 tacrolimus (PROTOPIC) 0.1 % ointment APPLY A THIN LAYER TO AFFECTED AREA (S) of THE FRONTAL SCALP AT BEDTIME, RUB GENTLY AND COMPLETELY. (Patient not taking: No sig reported) tolterodine (DETROL LA) 4 MG LA capsule Take 1 capsule (4 mg total) by mouth once daily (Patient not taking: Reported on 04/17/2019) 30 capsule 6 valACYclovir (VALTREX) 1000 MG tablet TAKE 1 TABLET (1,000 MG TOTAL) BY MOUTH 3 (THREE) TIMES DAILY FOR 7 DAYS. (Patient not taking: No sig reported)  No current facility-administered medications for this visit.  ALLERGIES: Patient has no known allergies.  PAST MEDICAL HISTORY: Past Medical History: Diagnosis Date Anemia Bell's palsy Fibroid uterus Headache History of hyperthyroidism 1999 resolved spontaneously Hypertension Menorrhagia Nontoxic goiter Palpitations  PAST SURGICAL HISTORY: Past  Surgical History: Procedure Laterality  Date ENDOMETRIAL ABLATION W/ NOVASURE 07/23/2008 [place] and left salpingectomy 12/25/2010 hydrosalpinx noted HYSTERECTOMY 12/25/2010 [place] CHOLECYSTECTOMY LAPAROSCOPIC CHOLECYSTECTOMY other surgery for deviated septum   FAMILY HISTORY: Family History Problem Relation Age of Onset Cataracts Mother Thyroid disease Mother partial thyroidectomy High blood pressure (Hypertension) Mother High blood pressure (Hypertension) Father No Known Problems Sister Breast cancer Maternal Aunt 35 Glaucoma Neg Hx Macular degeneration Neg Hx   SOCIAL HISTORY: Social History  Socioeconomic History Marital status: Married Occupational History Occupation: [employer] Tobacco Use Smoking status: Never Smokeless tobacco: Never Substance and Sexual Activity Alcohol use: No Drug use: No Sexual activity: Yes Partners: Male Birth control/protection: Surgical Comment: LSH  PHYSICAL EXAM: Vitals: 11/24/21 0928 BP: (!) 145/92 Pulse: 80  Body mass index is 23.59 kg/m. Weight: 58.5 kg (129 lb)  GENERAL: Alert, active, oriented x3  HEENT: Pupils equal reactive to light. Extraocular movements are intact. Sclera clear. Palpebral conjunctiva normal red color.Pharynx clear.  NECK: Supple with no palpable mass and no adenopathy.  LUNGS: Sound clear with no rales rhonchi or wheezes.  HEART: Regular rhythm S1 and S2 without murmur.  BREAST: breasts appear normal, no suspicious masses, no skin or nipple changes or axillary nodes.  ABDOMEN: Soft and depressible, nontender with no palpable mass, no hepatomegaly.  EXTREMITIES: Well-developed well-nourished symmetrical with no dependent edema.  NEUROLOGICAL: Awake alert oriented, facial expression symmetrical, moving all extremities.  REVIEW OF DATA: I have reviewed the following data today: Office Visit on 09/25/2021 Component Date Value Diagnostic Interpretation 09/25/2021 Comment Specimen adequacy: - Lab* 09/25/2021 Comment Clinician provided  ICD10* 09/25/2021 Comment PERFORMED BY: - [facility] 09/25/2021 Comment . - [facility] 09/25/2021 . Note: - [facility] 09/25/2021 Comment Test Method MT21 - [facility] 09/25/2021 Comment HPV APTIMA - [facility] 09/25/2021 Negative HPV Genotype Reflex - [facility]* 09/25/2021 Comment   ASSESSMENT: Ms. Swantek is a 51 y.o. female presenting for consultation for right breast radial scar with atypia.  Patient was oriented again about the pathology results. Surgical alternatives were discussed with patient including radiofrequency tag excisional biopsy. Surgical technique and post operative care was discussed with patient. Risk of surgery was discussed with patient including but not limited to: wound infection, seroma, hematoma, brachial plexopathy, mondor's disease (thrombosis of small veins of breast), chronic wound pain, breast lymphedema, altered sensation to the nipple and cosmesis among others.  I discussed with patient that the finding of radial scar especially with atypia has a high risk of upgrading to malignancy. My recommendation is to proceed with excisional biopsy instead of observation.  Patient requested to get the excisional biopsy the second week of October because she is moving. We will schedule her for the second week of October as per patient request.  Radial scar of breast [N64.89]  PLAN: 1. Right breast radiofrequency tag guided excisional biopsy (19125) 2. Avoid taking aspirin 5 days before the procedure 3. Contact us if you have any concern  Patient verbalized understanding, all questions were answered, and were agreeable with the plan outlined above.  Herbert Pun, MD  Electronically signed by Herbert Pun, MD Electronically signed by Windell Moment, Michaelyn Barter, MD at 11/24/2021 10:25 AM EDT

## 2022-01-05 NOTE — Anesthesia Preprocedure Evaluation (Addendum)
Anesthesia Evaluation  Patient identified by MRN, date of birth, ID band Patient awake    Reviewed: Allergy & Precautions, NPO status , Patient's Chart, lab work & pertinent test results  Airway Mallampati: II  TM Distance: >3 FB Neck ROM: full    Dental   Pulmonary neg pulmonary ROS,    Pulmonary exam normal        Cardiovascular hypertension, Normal cardiovascular exam     Neuro/Psych negative psych ROS   GI/Hepatic negative GI ROS, Neg liver ROS,   Endo/Other  Hyperthyroidism   Renal/GU      Musculoskeletal   Abdominal Normal abdominal exam  (+)   Peds  Hematology negative hematology ROS (+)   Anesthesia Other Findings Past Medical History: 01/2021: COVID No date: Family history of adverse reaction to anesthesia     Comment:  mother vomiting off balance No date: Headache No date: Hypertension No date: Hyperthyroidism  Past Surgical History: No date: ABDOMINAL HYSTERECTOMY 11/07/2021: BREAST BIOPSY; Right     Comment:  rt br bx distortion, x clip, path pending No date: CHOLECYSTECTOMY 2010: ENDOMETRIAL ABLATION 11/02/2016: NASAL TURBINATE REDUCTION; Bilateral     Comment:  Procedure: TURBINATE REDUCTION/SUBMUCOSAL RESECTION;                Surgeon: Beverly Gust, MD;  Location: [place];  Service: ENT;  Laterality: Bilateral; No date: PARTIAL HYSTERECTOMY 11/02/2016: SEPTOPLASTY; Bilateral     Comment:  Procedure: SEPTOPLASTY;  Surgeon: Beverly Gust, MD;               Location: [place];  Service: ENT;                Laterality: Bilateral;     Reproductive/Obstetrics negative OB ROS                            Anesthesia Physical Anesthesia Plan  ASA: 2  Anesthesia Plan: General LMA   Post-op Pain Management: Toradol IV (intra-op)* and Ofirmev IV (intra-op)*   Induction: Intravenous  PONV Risk Score and Plan: 3 and  Dexamethasone, Ondansetron and Midazolam  Airway Management Planned: LMA  Additional Equipment:   Intra-op Plan:   Post-operative Plan: Extubation in OR  Informed Consent: I have reviewed the patients History and Physical, chart, labs and discussed the procedure including the risks, benefits and alternatives for the proposed anesthesia with the patient or authorized representative who has indicated his/her understanding and acceptance.     Dental Advisory Given  Plan Discussed with: Anesthesiologist, CRNA and Surgeon  Anesthesia Plan Comments:        Anesthesia Quick Evaluation

## 2022-01-05 NOTE — Discharge Instructions (Addendum)
  Diet: Resume home heart healthy regular diet.   Activity: Increase activity as tolerated. Light activity and walking are encouraged. Do not drive or drink alcohol if taking narcotic pain medications.  Wound care: May shower with soapy water and pat dry (do not rub incisions), but no baths or submerging incision underwater until follow-up. (no swimming)   Medications: Resume all home medications. For mild to moderate pain: acetaminophen (Tylenol) or ibuprofen (if no kidney disease). Combining Tylenol with alcohol can substantially increase your risk of causing liver disease. Narcotic pain medications, if prescribed, can be used for severe pain, though may cause nausea, constipation, and drowsiness. Do not combine Tylenol and Norco within a 6 hour period as Norco contains Tylenol. If you do not need the narcotic pain medication, you do not need to fill the prescription.  Call office ([phone]) at any time if any questions, worsening pain, fevers/chills, bleeding, drainage from incision site, or other concerns.   AMBULATORY SURGERY  DISCHARGE INSTRUCTIONS   The drugs that you were given will stay in your system until tomorrow so for the next 24 hours you should not:  Drive an automobile Make any legal decisions Drink any alcoholic beverage   You may resume regular meals tomorrow.  Today it is better to start with liquids and gradually work up to solid foods.  You may eat anything you prefer, but it is better to start with liquids, then soup and crackers, and gradually work up to solid foods.   Please notify your doctor immediately if you have any unusual bleeding, trouble breathing, redness and pain at the surgery site, drainage, fever, or pain not relieved by medication.    Additional Instructions:        Please contact your physician with any problems or Same Day Surgery at [phone], Monday through Friday 6 am to 4 pm, or [place] [facility] [place] Main number at  [phone]. 

## 2022-01-05 NOTE — Transfer of Care (Addendum)
Immediate Anesthesia Transfer of Care Note  Patient: Kristin Hunter  Procedure(s) Performed: BREAST BIOPSY WITH RADIO FREQUENCY LOCALIZER (Right: Breast)  Patient Location: PACU  Anesthesia Type:General  Level of Consciousness: drowsy  Airway & Oxygen Therapy: Patient Spontanous Breathing and Patient connected to face mask oxygen  Post-op Assessment: Report given to RN and Post -op Vital signs reviewed and stable  Post vital signs: Reviewed and stable  Last Vitals:  Vitals Value Taken Time  BP 142/87 01/05/22 1145  Temp 36.1 C 01/05/22 1145  Pulse 99 01/05/22 1153  Resp 14 01/05/22 1153  SpO2 100 % 01/05/22 1153  Vitals shown include unvalidated device data.  Last Pain:  Vitals:   01/05/22 1151  TempSrc:   PainSc: Asleep         Complications: No notable events documented.

## 2022-01-05 NOTE — Anesthesia Procedure Notes (Signed)
Procedure Name: LMA Insertion Date/Time: 01/05/2022 10:43 AM  Performed by: Beverely Low, CRNAPre-anesthesia Checklist: Patient identified, Patient being monitored, Timeout performed, Emergency Drugs available and Suction available Patient Re-evaluated:Patient Re-evaluated prior to induction Oxygen Delivery Method: Circle system utilized Preoxygenation: Pre-oxygenation with 100% oxygen Induction Type: IV induction Ventilation: Mask ventilation without difficulty LMA Size: 3.0 Tube type: Oral Number of attempts: 1 Placement Confirmation: positive ETCO2 and breath sounds checked- equal and bilateral Tube secured with: Tape Dental Injury: Teeth and Oropharynx as per pre-operative assessment

## 2022-01-05 NOTE — Anesthesia Postprocedure Evaluation (Signed)
Anesthesia Post Note  Patient: Kristin Hunter  Procedure(s) Performed: BREAST BIOPSY WITH RADIO FREQUENCY LOCALIZER (Right: Breast)  Patient location during evaluation: PACU Anesthesia Type: General Level of consciousness: awake and alert Pain management: pain level controlled Vital Signs Assessment: post-procedure vital signs reviewed and stable Respiratory status: spontaneous breathing, nonlabored ventilation and respiratory function stable Cardiovascular status: blood pressure returned to baseline and stable Postop Assessment: no apparent nausea or vomiting Anesthetic complications: no   No notable events documented.   Last Vitals:  Vitals:   01/05/22 1200 01/05/22 1220  BP: (!) 140/91 (!) 150/91  Pulse: 87 74  Resp: 16 16  Temp: (!) 36.3 C (!) 36.1 C  SpO2: 100%     Last Pain:  Vitals:   01/05/22 1220  TempSrc: Tympanic  PainSc: 0-No pain                 [place] Ouch

## 2022-01-08 ENCOUNTER — Encounter: Payer: Self-pay | Admitting: General Surgery

## 2022-01-09 LAB — SURGICAL PATHOLOGY

## 2022-01-10 DIAGNOSIS — I10 Essential (primary) hypertension: Secondary | ICD-10-CM | POA: Diagnosis not present

## 2022-02-09 DIAGNOSIS — I10 Essential (primary) hypertension: Secondary | ICD-10-CM | POA: Diagnosis not present

## 2022-05-01 ENCOUNTER — Encounter: Payer: Self-pay | Admitting: Family Medicine

## 2022-05-01 ENCOUNTER — Ambulatory Visit: Payer: BC Managed Care – PPO | Admitting: Family Medicine

## 2022-05-01 VITALS — BP 110/70 | HR 89 | Temp 97.7°F | Ht 62.25 in | Wt 130.4 lb

## 2022-05-01 DIAGNOSIS — I10 Essential (primary) hypertension: Secondary | ICD-10-CM

## 2022-05-01 DIAGNOSIS — N951 Menopausal and female climacteric states: Secondary | ICD-10-CM | POA: Insufficient documentation

## 2022-05-01 DIAGNOSIS — Z8639 Personal history of other endocrine, nutritional and metabolic disease: Secondary | ICD-10-CM

## 2022-05-01 DIAGNOSIS — R0683 Snoring: Secondary | ICD-10-CM

## 2022-05-01 DIAGNOSIS — Z0000 Encounter for general adult medical examination without abnormal findings: Secondary | ICD-10-CM

## 2022-05-01 DIAGNOSIS — Z1211 Encounter for screening for malignant neoplasm of colon: Secondary | ICD-10-CM

## 2022-05-01 DIAGNOSIS — G43009 Migraine without aura, not intractable, without status migrainosus: Secondary | ICD-10-CM

## 2022-05-01 NOTE — Assessment & Plan Note (Signed)
Occurring 1-2 times a month.. treats with BC powder.

## 2022-05-01 NOTE — Patient Instructions (Addendum)
I was nice meeting you today!  Keep working on stress reduction, healthy eating and start regular exercise/yoga back!

## 2022-05-01 NOTE — Progress Notes (Signed)
Patient ID: Kristin Hunter, female    DOB: Mar 19, 1970, 52 y.o.   MRN: [id number]  This visit was conducted in person.  BP 110/70   Pulse 89   Temp 97.7 F (36.5 C) (Temporal)   Ht 5' 2.25" (1.581 m)   Wt 130 lb 6 oz (59.1 kg)   LMP 11/27/2010   SpO2 98%   BMI 23.65 kg/m    CC:  Chief Complaint  Patient presents with   Establish Care    Subjective:   HPI: Kristin Hunter is a 52 y.o. female presenting on 05/01/2022 for Establish Care  Previous PCP: Einar Pheasant  Last physical: 09/2021 GYN Dr. Leafy Ro.  Hypertension:  Well-controlled on lisinopril 20 mg daily  BP Readings from Last 3 Encounters:  05/01/22 110/70  01/05/22 (!) 150/91  01/02/22 136/80  Using medication without problems or lightheadedness:  none Chest pain with exertion:none Edema:none Short of breath: none Average home BPs: not checking Other issues:  Postmenopausal syndrome: Treated with Climara patch.. she is trying to wean off over all. S/P hysterectomy for fibroids   She has a lot of stress with work.. causes fatigue at times.  Relevant past medical, surgical, family and social history reviewed and updated as indicated. Interim medical history since our last visit reviewed. Allergies and medications reviewed and updated. Outpatient Medications Prior to Visit  Medication Sig Dispense Refill   Aspirin-Salicylamide-Caffeine (BC HEADACHE POWDER PO) Take by mouth daily as needed.     estradiol (CLIMARA - DOSED IN MG/24 HR) 0.05 mg/24hr patch Place 0.05 mg onto the skin once a week.     lisinopril (ZESTRIL) 20 MG tablet Take 1 tablet by mouth daily. 90 tablet 3   Multiple Vitamin (MULTI-VITAMINS) TABS Take 1 tablet by mouth daily.     ibuprofen (ADVIL) 200 MG tablet Take 200 mg by mouth every 6 (six) hours as needed.     No facility-administered medications prior to visit.     Per HPI unless specifically indicated in ROS section below Review of Systems  Constitutional:  Negative for fatigue and fever.   HENT:  Negative for congestion.   Eyes:  Negative for pain.  Respiratory:  Negative for cough and shortness of breath.   Cardiovascular:  Negative for chest pain, palpitations and leg swelling.  Gastrointestinal:  Negative for abdominal pain.  Genitourinary:  Negative for dysuria and vaginal bleeding.  Musculoskeletal:  Negative for back pain.  Neurological:  Negative for syncope, light-headedness and headaches.  Psychiatric/Behavioral:  Negative for dysphoric mood.    Objective:  BP 110/70   Pulse 89   Temp 97.7 F (36.5 C) (Temporal)   Ht 5' 2.25" (1.581 m)   Wt 130 lb 6 oz (59.1 kg)   LMP 11/27/2010   SpO2 98%   BMI 23.65 kg/m   Wt Readings from Last 3 Encounters:  05/01/22 130 lb 6 oz (59.1 kg)  01/05/22 128 lb (58.1 kg)  01/02/22 128 lb (58.1 kg)      Physical Exam Constitutional:      General: She is not in acute distress.    Appearance: Normal appearance. She is well-developed. She is not ill-appearing or toxic-appearing.  HENT:     Head: Normocephalic.     Right Ear: Hearing, tympanic membrane, ear canal and external ear normal.     Left Ear: Hearing, tympanic membrane, ear canal and external ear normal.     Nose: Nose normal.  Eyes:     General: Lids are normal.  Lids are everted, no foreign bodies appreciated.     Conjunctiva/sclera: Conjunctivae normal.     Pupils: Pupils are equal, round, and reactive to light.  Neck:     Thyroid: Thyromegaly present. No thyroid mass.     Vascular: No carotid bruit.     Trachea: Trachea normal.  Cardiovascular:     Rate and Rhythm: Normal rate and regular rhythm.     Heart sounds: Normal heart sounds, S1 normal and S2 normal. No murmur heard.    No gallop.  Pulmonary:     Effort: Pulmonary effort is normal. No respiratory distress.     Breath sounds: Normal breath sounds. No wheezing, rhonchi or rales.  Abdominal:     General: Bowel sounds are normal. There is no distension or abdominal bruit.     Palpations: Abdomen  is soft. There is no fluid wave or mass.     Tenderness: There is no abdominal tenderness. There is no guarding or rebound.     Hernia: No hernia is present.  Genitourinary:    Exam position: Supine.     Labia:        Right: No rash, tenderness or lesion.        Left: No rash, tenderness or lesion.      Vagina: Normal.     Cervix: No cervical motion tenderness, discharge or friability.     Uterus: Not enlarged and not tender.      Adnexa:        Right: No mass, tenderness or fullness.         Left: No mass, tenderness or fullness.    Musculoskeletal:     Cervical back: Normal range of motion and neck supple.  Lymphadenopathy:     Cervical: No cervical adenopathy.  Skin:    General: Skin is warm and dry.     Findings: No rash.  Neurological:     Mental Status: She is alert.     Cranial Nerves: No cranial nerve deficit.     Sensory: No sensory deficit.  Psychiatric:        Mood and Affect: Mood is not anxious or depressed.        Speech: Speech normal.        Behavior: Behavior normal. Behavior is cooperative.        Judgment: Judgment normal.       Results for orders placed or performed in visit on 05/01/22  HM PAP SMEAR  Result Value Ref Range   HM Pap smear NEGATIVE FOR INTRAEPITHELIAL LESION OR MALIGNANCY.   Results Console HPV  Result Value Ref Range   CHL HPV Negative     Assessment and Plan  The patient's preventative maintenance and recommended screening tests for an annual wellness exam were reviewed in full today. Brought up to date unless services declined.  Counselled on the importance of diet, exercise, and its role in overall health and mortality. The patient's FH and SH was reviewed, including their home life, tobacco status, and drug and alcohol status.    Referral placed for colonoscopy  Mammo and pap done with GYN  Uptodate with td and flu.. discussed shingrix... she will consider.  No drug use, no ETOH   Nonsmoker.  Routine general medical  examination at a health care facility  Primary hypertension Assessment & Plan: Stable, chronic.  Continue current medication.  Lisinopril 20 mg daily  Orders: -     Lipid panel; Future -     Comprehensive metabolic  panel; Future  Post menopausal syndrome Assessment & Plan: Stable, chronic.  Continue current medication.  Climara patch   Snoring Assessment & Plan: Had sleep study.. none seen 2017 Deviated septum surgery.   Migraine without aura and without status migrainosus, not intractable Assessment & Plan:  Occurring 1-2 times a month.. treats with BC powder.   Colon cancer screening -     Ambulatory referral to Gastroenterology  History of hyperthyroidism -     TSH; Future -     T4, free; Future -     T3, free; Future    Return in about 2 weeks (around 05/15/2022) for  fasting lab only.   Eliezer Lofts, MD

## 2022-05-01 NOTE — Assessment & Plan Note (Signed)
Stable, chronic.  Continue current medication.  Climara patch

## 2022-05-01 NOTE — Assessment & Plan Note (Signed)
Had sleep study.. none seen 2017 Deviated septum surgery.

## 2022-05-01 NOTE — Assessment & Plan Note (Signed)
Stable, chronic.  Continue current medication.  Lisinopril 20 mg daily

## 2022-05-11 ENCOUNTER — Encounter: Payer: Self-pay | Admitting: *Deleted

## 2022-05-11 ENCOUNTER — Encounter: Payer: Self-pay | Admitting: Family Medicine

## 2022-05-17 ENCOUNTER — Other Ambulatory Visit: Payer: BC Managed Care – PPO

## 2022-05-18 ENCOUNTER — Other Ambulatory Visit (INDEPENDENT_AMBULATORY_CARE_PROVIDER_SITE_OTHER): Payer: BC Managed Care – PPO

## 2022-05-18 DIAGNOSIS — I10 Essential (primary) hypertension: Secondary | ICD-10-CM

## 2022-05-18 DIAGNOSIS — Z8639 Personal history of other endocrine, nutritional and metabolic disease: Secondary | ICD-10-CM | POA: Diagnosis not present

## 2022-05-18 LAB — COMPREHENSIVE METABOLIC PANEL
ALT: 9 U/L (ref 0–35)
AST: 10 U/L (ref 0–37)
Albumin: 4 g/dL (ref 3.5–5.2)
Alkaline Phosphatase: 48 U/L (ref 39–117)
BUN: 20 mg/dL (ref 6–23)
CO2: 29 mEq/L (ref 19–32)
Calcium: 9.6 mg/dL (ref 8.4–10.5)
Chloride: 103 mEq/L (ref 96–112)
Creatinine, Ser: 0.96 mg/dL (ref 0.40–1.20)
GFR: 68.14 mL/min (ref >60.00)
Glucose, Bld: 80 mg/dL (ref 70–99)
Potassium: 4.1 mEq/L (ref 3.5–5.1)
Sodium: 140 mEq/L (ref 135–145)
Total Bilirubin: 0.5 mg/dL (ref 0.2–1.2)
Total Protein: 6.8 g/dL (ref 6.0–8.3)

## 2022-05-18 LAB — T3, FREE: T3, Free: 2.7 pg/mL (ref 2.3–4.2)

## 2022-05-18 LAB — LIPID PANEL
Cholesterol: 130 mg/dL (ref 0–200)
HDL: 48.7 mg/dL (ref >39.00)
LDL Cholesterol: 61 mg/dL (ref 0–99)
NonHDL: 81.76
Total CHOL/HDL Ratio: 3
Triglycerides: 102 mg/dL (ref 0.0–149.0)
VLDL: 20.4 mg/dL (ref 0.0–40.0)

## 2022-05-18 LAB — TSH: TSH: 1.33 u[IU]/mL (ref 0.35–5.50)

## 2022-05-18 LAB — T4, FREE: Free T4: 0.56 ng/dL — ABNORMAL LOW (ref 0.60–1.60)

## 2022-06-15 NOTE — Telephone Encounter (Signed)
error 

## 2022-08-07 ENCOUNTER — Other Ambulatory Visit: Payer: Self-pay | Admitting: *Deleted

## 2022-08-07 DIAGNOSIS — I10 Essential (primary) hypertension: Secondary | ICD-10-CM

## 2022-08-07 MED ORDER — LISINOPRIL 20 MG PO TABS: 20.0000 mg | ORAL_TABLET | Freq: Every day | ORAL | 2 refills | Status: DC

## 2022-11-08 IMAGING — US US BREAST*L* LIMITED INC AXILLA
1 series · 7 of 7 positions shown · non-contrast
Comparison: Previous exam(s).

CLINICAL DATA: 49-year-old female with intermittent pain behind the
left nipple and in the left axilla.

EXAM:
DIGITAL DIAGNOSTIC BILATERAL MAMMOGRAM WITH TOMOSYNTHESIS AND CAD;
ULTRASOUND LEFT BREAST LIMITED
TECHNIQUE: Bilateral digital diagnostic mammography and breast tomosynthesis
was performed. The images were evaluated with computer-aided
detection.; Targeted ultrasound examination of the left breast was
performed

[Series 1: us breast*left* limited inc axilla · 0.06mm/px · 7 of 7 slices shown]
[im 1/7]
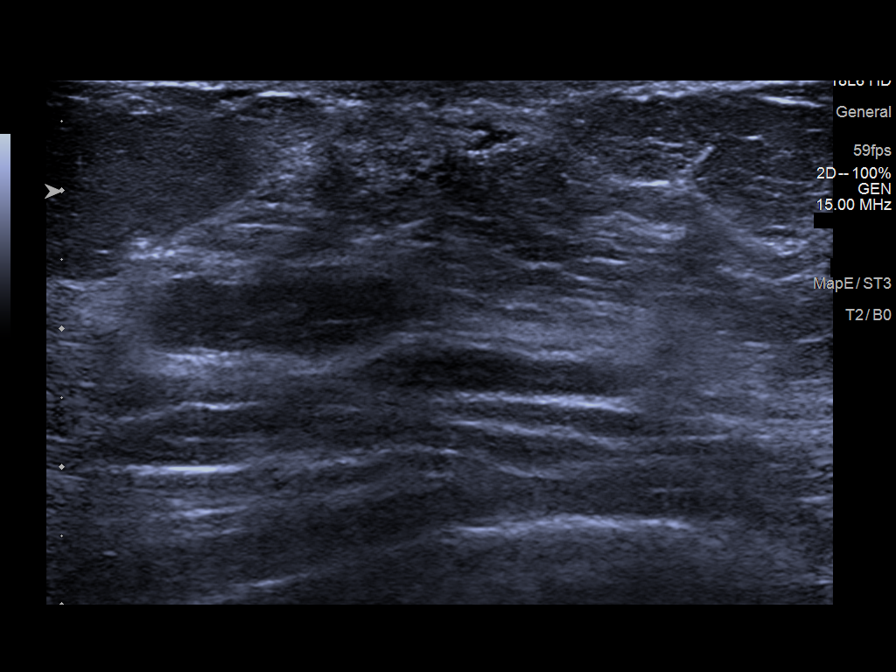
[im 2/7]
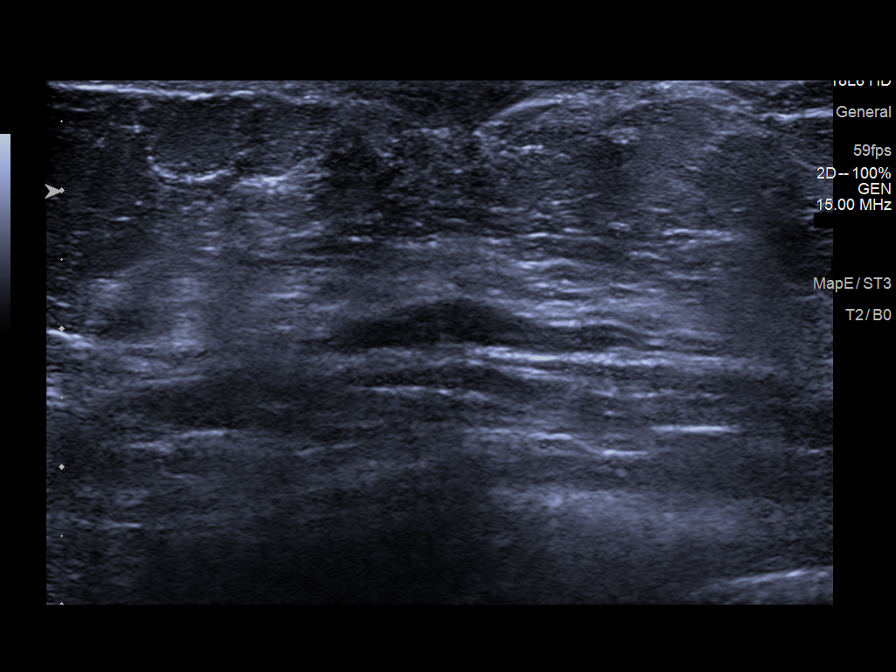
[im 3/7]
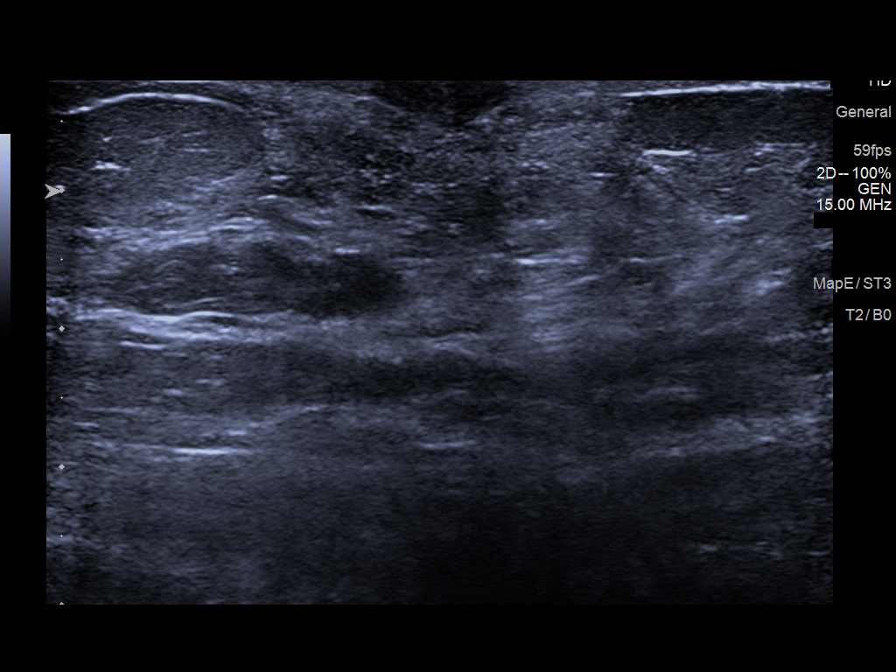
[im 4/7]
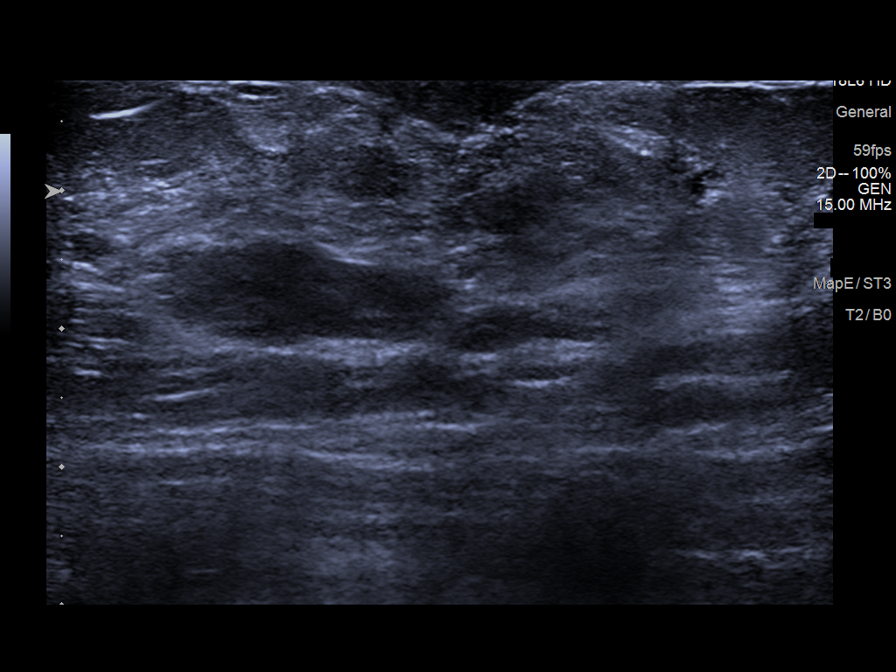
[im 5/7]
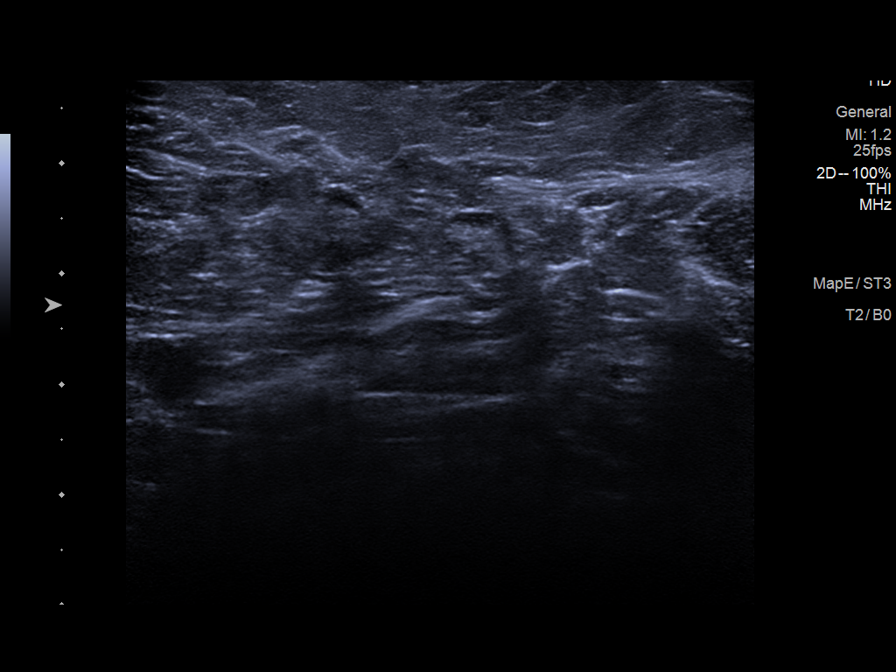
[im 6/7]
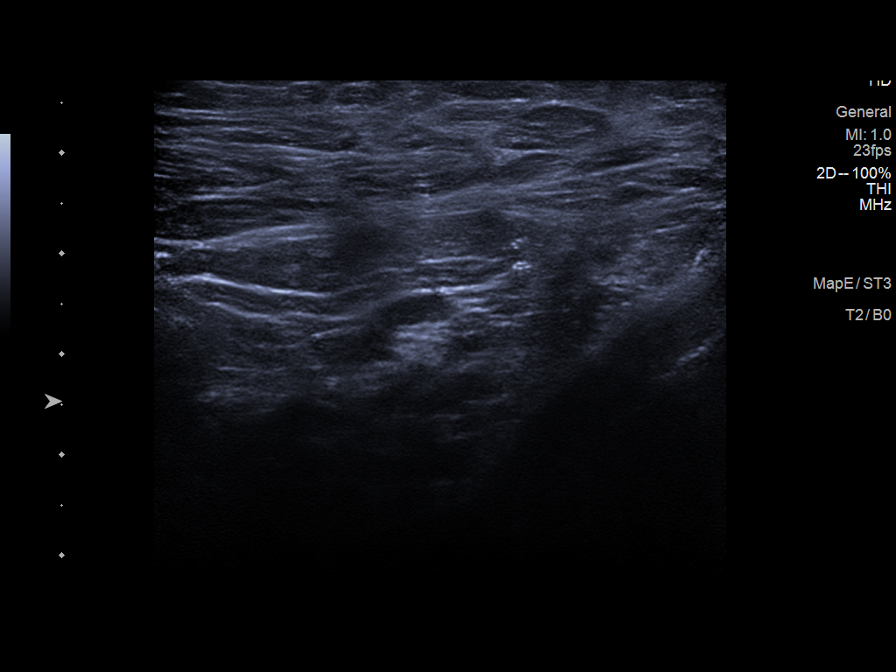
[im 7/7]
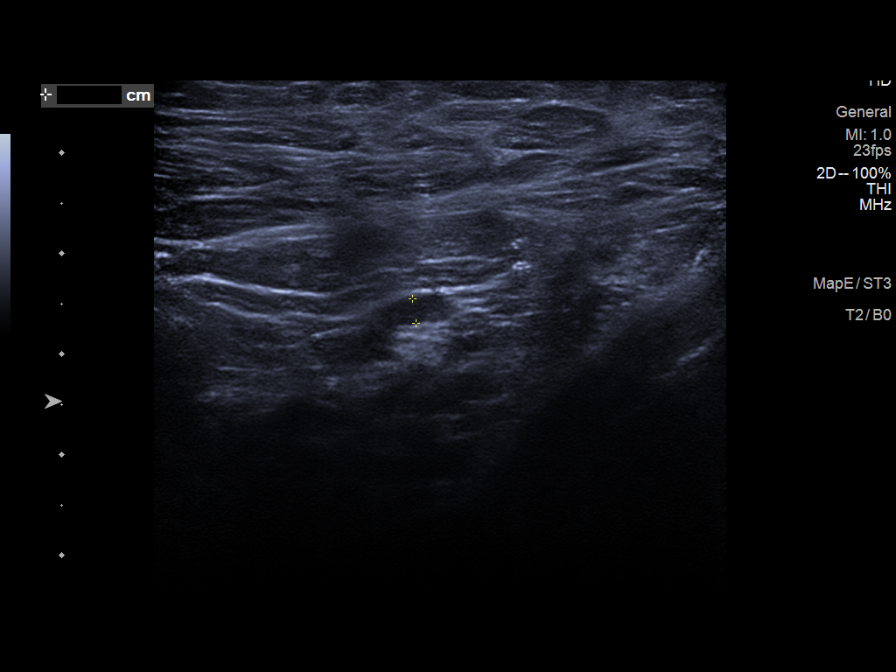

[7 of 7 positions shown; findings below may reference images not displayed]

ACR Breast Density Category c: The breast tissue is heterogeneously
dense, which may obscure small masses.
FINDINGS: Questioned distortion in the lower inner quadrant of the right
breast does not persist on additional spot views. No new or
suspicious mammographic findings in either breast. The parenchymal
pattern is stable.

Targeted ultrasound is performed, showing normal fibroglandular
tissue without focal or suspicious sonographic abnormality.
Evaluation of the retroareolar left breast and left axilla was
performed. Note is made of morphologically normal left axillary
lymph nodes.
IMPRESSION: 1. No mammographic evidence of malignancy in either breast.
2. No suspicious sonographic findings in the retroareolar left
breast and left axilla.

RECOMMENDATION:
1. Clinical follow-up recommended for the painful area of concern in
the left breast. Any further workup should be based on clinical
grounds.
2.  Screening mammogram in one year.(Code:C0-6-Z8E)

I have discussed the findings and recommendations with the patient.
If applicable, a reminder letter will be sent to the patient
regarding the next appointment.

BI-RADS CATEGORY  1: Negative.

## 2022-11-13 ENCOUNTER — Other Ambulatory Visit: Payer: Self-pay | Admitting: *Deleted

## 2022-11-13 DIAGNOSIS — I10 Essential (primary) hypertension: Secondary | ICD-10-CM

## 2022-11-13 MED ORDER — LISINOPRIL 20 MG PO TABS: 20.0000 mg | ORAL_TABLET | Freq: Every day | ORAL | 1 refills | Status: DC

## 2022-11-15 ENCOUNTER — Encounter: Payer: Self-pay | Admitting: Family Medicine

## 2022-11-15 ENCOUNTER — Ambulatory Visit: Payer: BC Managed Care – PPO | Admitting: Family Medicine

## 2022-11-15 VITALS — BP 126/80 | HR 80 | Temp 98.1°F | Ht 62.25 in | Wt 137.5 lb

## 2022-11-15 DIAGNOSIS — I499 Cardiac arrhythmia, unspecified: Secondary | ICD-10-CM | POA: Diagnosis not present

## 2022-11-15 DIAGNOSIS — I10 Essential (primary) hypertension: Secondary | ICD-10-CM | POA: Diagnosis not present

## 2022-11-15 DIAGNOSIS — E010 Iodine-deficiency related diffuse (endemic) goiter: Secondary | ICD-10-CM

## 2022-11-15 LAB — CBC WITH DIFFERENTIAL/PLATELET
Basophils Absolute: 0 10*3/uL (ref 0.0–0.1)
Basophils Relative: 1.1 % (ref 0.0–3.0)
Eosinophils Absolute: 0.1 10*3/uL (ref 0.0–0.7)
Eosinophils Relative: 1.9 % (ref 0.0–5.0)
HCT: 40.1 % (ref 36.0–46.0)
Hemoglobin: 13.5 g/dL (ref 12.0–15.0)
Lymphocytes Relative: 50.7 % — ABNORMAL HIGH (ref 12.0–46.0)
Lymphs Abs: 2 10*3/uL (ref 0.7–4.0)
MCHC: 33.6 g/dL (ref 30.0–36.0)
MCV: 93.7 fl (ref 78.0–100.0)
Monocytes Absolute: 0.4 10*3/uL (ref 0.1–1.0)
Monocytes Relative: 9.9 % (ref 3.0–12.0)
Neutro Abs: 1.4 10*3/uL (ref 1.4–7.7)
Neutrophils Relative %: 36.4 % — ABNORMAL LOW (ref 43.0–77.0)
Platelets: 381 10*3/uL (ref 150.0–400.0)
RBC: 4.28 Mil/uL (ref 3.87–5.11)
RDW: 14 % (ref 11.5–15.5)
WBC: 3.9 10*3/uL — ABNORMAL LOW (ref 4.0–10.5)

## 2022-11-15 LAB — COMPREHENSIVE METABOLIC PANEL
ALT: 13 U/L (ref 0–35)
AST: 15 U/L (ref 0–37)
Albumin: 4.1 g/dL (ref 3.5–5.2)
Alkaline Phosphatase: 51 U/L (ref 39–117)
BUN: 16 mg/dL (ref 6–23)
CO2: 29 meq/L (ref 19–32)
Calcium: 9.3 mg/dL (ref 8.4–10.5)
Chloride: 105 meq/L (ref 96–112)
Creatinine, Ser: 0.66 mg/dL (ref 0.40–1.20)
GFR: 100.62 mL/min (ref >60.00)
Glucose, Bld: 82 mg/dL (ref 70–99)
Potassium: 4.2 meq/L (ref 3.5–5.1)
Sodium: 139 meq/L (ref 135–145)
Total Bilirubin: 0.3 mg/dL (ref 0.2–1.2)
Total Protein: 7.2 g/dL (ref 6.0–8.3)

## 2022-11-15 LAB — T4, FREE: Free T4: 0.86 ng/dL (ref 0.60–1.60)

## 2022-11-15 LAB — TSH: TSH: 1.03 u[IU]/mL (ref 0.35–5.50)

## 2022-11-15 LAB — T3, FREE: T3, Free: 3.8 pg/mL (ref 2.3–4.2)

## 2022-11-15 NOTE — Patient Instructions (Signed)
Push water intake, goal is closer to 64 ounces of water a day. Continue to reduce caffeine intake. Stop at the lab on your way out. We will work on setting up thyroid ultrasound.  Please contact us if you have not heard from the referral coordinator in the next 2 weeks.

## 2022-11-15 NOTE — Assessment & Plan Note (Signed)
Stable, chronic.  Continue current medication.  Lisinopril 20 mg daily.

## 2022-11-15 NOTE — Assessment & Plan Note (Signed)
Acute, No clear etiology but patient with recent history of high caffeine intake and some evidence of mild dehydration.  Continue reducing caffeine, increase water intake to 64 ounces a day.  EKG showed normal sinus rhythm, no LVH, no ST changes, no Q waves.  Stable from prior in 2023  Will evaluate for additional secondary causes of irregular heart rate with CBC, thyroid testing, complete metabolic panel.  If symptoms not improving in 2 to 4 weeks with lifestyle change and labs unremarkable can consider proceeding with ZIO monitor

## 2022-11-15 NOTE — Progress Notes (Signed)
Patient ID: Kristin Hunter, female    DOB: 03-09-1971, 52 y.o.   MRN: [id number]  This visit was conducted in person.  BP 126/80 (BP Location: Right Arm, Patient Position: Sitting, Cuff Size: Normal)   Pulse 80   Temp 98.1 F (36.7 C) (Temporal)   Ht 5' 2.25" (1.581 m)   Wt 137 lb 8 oz (62.4 kg)   LMP 11/27/2010   SpO2 98%   BMI 24.95 kg/m    CC:  Chief Complaint  Patient presents with   Palpitations    X 2 1/2 weeks    Subjective:   HPI: Kristin Hunter is a 52 y.o. female presenting on 11/15/2022 for Palpitations (X 2 1/2 weeks)    She reports new onset palpitations in last 2.5 weeks. Not fast just feels irregular.  Coughing can help it stop.  Notes a t rest as well as with exertion.. random.    She does drink a lot caffeine ( tea) TID .Marland Kitchen has decreased to 1-2 times a week.  She gets only 8 oz of water, get some additional lemonade.  No decongestants.  No anxiety, stress or depression.  New meds biotin  in last week.  BP Readings from Last 3 Encounters:  11/15/22 126/80  05/01/22 110/70  01/05/22 (!) 150/91    No recent COVID infection.    IN past has had occ irregular heart rate.. saw cardiology  for surgical clearance 10/23    11/27/2017  saw Dr. Mariah Milling for HTN  10.2023 ECHO LVEF 60 to 65%, no regional wall motion abnormalities, and no valvular abnormalities that were noted.    Has history of thyroid enlargement..   Relevant past medical, surgical, family and social history reviewed and updated as indicated. Interim medical history since our last visit reviewed. Allergies and medications reviewed and updated. Outpatient Medications Prior to Visit  Medication Sig Dispense Refill   Aspirin-Salicylamide-Caffeine (BC HEADACHE POWDER PO) Take by mouth daily as needed.     BIOTIN PO Take 2 each by mouth daily. Gummies     estradiol (CLIMARA - DOSED IN MG/24 HR) 0.05 mg/24hr patch Place 0.05 mg onto the skin once a week.     lisinopril (ZESTRIL) 20 MG tablet  Take 1 tablet (20 mg total) by mouth daily. 90 tablet 1   Multiple Vitamin (MULTI-VITAMINS) TABS Take 1 tablet by mouth daily.     No facility-administered medications prior to visit.     Per HPI unless specifically indicated in ROS section below Review of Systems  Constitutional:  Negative for fatigue and fever.  HENT:  Negative for congestion.   Eyes:  Negative for pain.  Respiratory:  Negative for cough and shortness of breath.   Cardiovascular:  Positive for palpitations. Negative for chest pain and leg swelling.  Gastrointestinal:  Negative for abdominal pain.  Genitourinary:  Negative for dysuria and vaginal bleeding.  Musculoskeletal:  Negative for back pain.  Neurological:  Negative for syncope, light-headedness and headaches.  Psychiatric/Behavioral:  Negative for dysphoric mood.    Objective:  BP 126/80 (BP Location: Right Arm, Patient Position: Sitting, Cuff Size: Normal)   Pulse 80   Temp 98.1 F (36.7 C) (Temporal)   Ht 5' 2.25" (1.581 m)   Wt 137 lb 8 oz (62.4 kg)   LMP 11/27/2010   SpO2 98%   BMI 24.95 kg/m   Wt Readings from Last 3 Encounters:  11/15/22 137 lb 8 oz (62.4 kg)  05/01/22 130 lb 6 oz (59.1  kg)  01/05/22 128 lb (58.1 kg)      Physical Exam Vitals and nursing note reviewed.  Constitutional:      General: She is not in acute distress.    Appearance: Normal appearance. She is well-developed. She is not ill-appearing or toxic-appearing.  HENT:     Head: Normocephalic.     Right Ear: Hearing, tympanic membrane, ear canal and external ear normal.     Left Ear: Hearing, tympanic membrane, ear canal and external ear normal.     Nose: Nose normal.  Eyes:     General: Lids are normal. Lids are everted, no foreign bodies appreciated.     Conjunctiva/sclera: Conjunctivae normal.     Pupils: Pupils are equal, round, and reactive to light.  Neck:     Thyroid: No thyroid mass or thyromegaly.     Vascular: No carotid bruit.     Trachea: Trachea normal.   Cardiovascular:     Rate and Rhythm: Normal rate and regular rhythm.     Heart sounds: Normal heart sounds, S1 normal and S2 normal. No murmur heard.    No gallop.  Pulmonary:     Effort: Pulmonary effort is normal. No respiratory distress.     Breath sounds: Normal breath sounds. No wheezing, rhonchi or rales.  Abdominal:     General: Bowel sounds are normal. There is no distension or abdominal bruit.     Palpations: Abdomen is soft. There is no fluid wave or mass.     Tenderness: There is no abdominal tenderness. There is no guarding or rebound.     Hernia: No hernia is present.  Musculoskeletal:     Cervical back: Normal range of motion and neck supple.  Lymphadenopathy:     Cervical: No cervical adenopathy.  Skin:    General: Skin is warm and dry.     Findings: No rash.  Neurological:     Mental Status: She is alert.     Cranial Nerves: No cranial nerve deficit.     Sensory: No sensory deficit.  Psychiatric:        Mood and Affect: Mood is not anxious or depressed.        Speech: Speech normal.        Behavior: Behavior normal. Behavior is cooperative.        Judgment: Judgment normal.       Results for orders placed or performed in visit on 05/18/22  T3, free  Result Value Ref Range   T3, Free 2.7 2.3 - 4.2 pg/mL  T4, free  Result Value Ref Range   Free T4 0.56 (L) 0.60 - 1.60 ng/dL  TSH  Result Value Ref Range   TSH 1.33 0.35 - 5.50 uIU/mL  Comprehensive metabolic panel  Result Value Ref Range   Sodium 140 135 - 145 mEq/L   Potassium 4.1 3.5 - 5.1 mEq/L   Chloride 103 96 - 112 mEq/L   CO2 29 19 - 32 mEq/L   Glucose, Bld 80 70 - 99 mg/dL   BUN 20 6 - 23 mg/dL   Creatinine, Ser 1.61 0.40 - 1.20 mg/dL   Total Bilirubin 0.5 0.2 - 1.2 mg/dL   Alkaline Phosphatase 48 39 - 117 U/L   AST 10 0 - 37 U/L   ALT 9 0 - 35 U/L   Total Protein 6.8 6.0 - 8.3 g/dL   Albumin 4.0 3.5 - 5.2 g/dL   GFR 09.60 >45.40 mL/min   Calcium 9.6 8.4 - 10.5  mg/dL  Lipid panel   Result Value Ref Range   Cholesterol 130 0 - 200 mg/dL   Triglycerides 161.0 0.0 - 149.0 mg/dL   HDL 96.04 >54.09 mg/dL   VLDL 81.1 0.0 - 91.4 mg/dL   LDL Cholesterol 61 0 - 99 mg/dL   Total CHOL/HDL Ratio 3    NonHDL 81.76     Assessment and Plan  Irregular heart beats Assessment & Plan: Acute, No clear etiology but patient with recent history of high caffeine intake and some evidence of mild dehydration.  Continue reducing caffeine, increase water intake to 64 ounces a day.  EKG showed normal sinus rhythm, no LVH, no ST changes, no Q waves.  Stable from prior in 2023  Will evaluate for additional secondary causes of irregular heart rate with CBC, thyroid testing, complete metabolic panel.  If symptoms not improving in 2 to 4 weeks with lifestyle change and labs unremarkable can consider proceeding with ZIO monitor      Orders: -     Comprehensive metabolic panel -     TSH -     CBC with Differential/Platelet -     T4, free -     T3, free -     EKG 12-Lead  Thyromegaly Assessment & Plan: Chronic, per patient no change in size of thyroid.  Per patient she does not recall past thyroid ultrasound.  At last check T4 slightly low ( possible lab error) with normal TSH, T4.  Reevaluate with thyroid ultrasound and thyroid function test.  Orders: -     T4, free -     T3, free -     US THYROID; Future  Primary hypertension Assessment & Plan: Stable, chronic.  Continue current medication.  Lisinopril 20 mg daily     No follow-ups on file.   Kerby Nora, MD

## 2022-11-15 NOTE — Assessment & Plan Note (Addendum)
Chronic, per patient no change in size of thyroid.  Per patient she does not recall past thyroid ultrasound.  At last check T4 slightly low ( possible lab error) with normal TSH, T4.  Reevaluate with thyroid ultrasound and thyroid function test.

## 2022-11-16 ENCOUNTER — Encounter: Payer: Self-pay | Admitting: *Deleted

## 2022-12-07 ENCOUNTER — Ambulatory Visit: Payer: BC Managed Care – PPO

## 2023-01-07 DIAGNOSIS — L659 Nonscarring hair loss, unspecified: Secondary | ICD-10-CM | POA: Diagnosis not present

## 2023-01-07 DIAGNOSIS — Z01419 Encounter for gynecological examination (general) (routine) without abnormal findings: Secondary | ICD-10-CM | POA: Diagnosis not present

## 2023-01-07 DIAGNOSIS — Z1231 Encounter for screening mammogram for malignant neoplasm of breast: Secondary | ICD-10-CM | POA: Diagnosis not present

## 2023-01-07 DIAGNOSIS — N951 Menopausal and female climacteric states: Secondary | ICD-10-CM | POA: Diagnosis not present

## 2023-01-08 ENCOUNTER — Other Ambulatory Visit: Payer: Self-pay | Admitting: Obstetrics and Gynecology

## 2023-01-08 DIAGNOSIS — Z1231 Encounter for screening mammogram for malignant neoplasm of breast: Secondary | ICD-10-CM

## 2023-01-17 ENCOUNTER — Ambulatory Visit: Payer: BC Managed Care – PPO | Admitting: Family Medicine

## 2023-01-17 ENCOUNTER — Encounter: Payer: Self-pay | Admitting: Family Medicine

## 2023-01-17 VITALS — BP 132/80 | HR 91 | Temp 98.8°F | Ht 62.25 in | Wt 139.1 lb

## 2023-01-17 DIAGNOSIS — H6121 Impacted cerumen, right ear: Secondary | ICD-10-CM

## 2023-01-17 NOTE — Progress Notes (Signed)
Patient ID: Kristin Hunter, female    DOB: November 27, 1970, 52 y.o.   MRN: [id number]  This visit was conducted in person.  BP 132/80 (BP Location: Right Arm, Patient Position: Sitting, Cuff Size: Normal)   Pulse 91   Temp 98.8 F (37.1 C) (Temporal)   Ht 5' 2.25" (1.581 m)   Wt 139 lb 2 oz (63.1 kg)   LMP 11/27/2010   SpO2 98%   BMI 25.24 kg/m    CC:  Chief Complaint  Patient presents with   Clogged Ear    Right    Subjective:   HPI: Kristin Hunter is a 52 y.o. female presenting on 01/17/2023 for Clogged Ear (Right)  Right ear pressure noted in the last  2 week.  Has tried to treat with peroxide .Marland Kitchen With minimal results.  No pain, no cough, no SOB, no Fever.   Does not use Qtips.      Relevant past medical, surgical, family and social history reviewed and updated as indicated. Interim medical history since our last visit reviewed. Allergies and medications reviewed and updated. Outpatient Medications Prior to Visit  Medication Sig Dispense Refill   Aspirin-Salicylamide-Caffeine (BC HEADACHE POWDER PO) Take by mouth daily as needed.     BIOTIN PO Take 2 each by mouth daily. Gummies     COD LIVER OIL PO Take 1 Dose by mouth daily.     estradiol (CLIMARA - DOSED IN MG/24 HR) 0.05 mg/24hr patch Place 0.05 mg onto the skin once a week.     lisinopril (ZESTRIL) 20 MG tablet Take 1 tablet (20 mg total) by mouth daily. 90 tablet 1   Multiple Vitamin (MULTI-VITAMINS) TABS Take 1 tablet by mouth daily.     No facility-administered medications prior to visit.     Per HPI unless specifically indicated in ROS section below Review of Systems  Constitutional:  Negative for fatigue and fever.  HENT:  Negative for congestion.   Eyes:  Negative for pain.  Respiratory:  Negative for cough and shortness of breath.   Cardiovascular:  Negative for chest pain, palpitations and leg swelling.  Gastrointestinal:  Negative for abdominal pain.  Genitourinary:  Negative for dysuria and  vaginal bleeding.  Musculoskeletal:  Negative for back pain.  Neurological:  Negative for syncope, light-headedness and headaches.  Psychiatric/Behavioral:  Negative for dysphoric mood.    Objective:  BP 132/80 (BP Location: Right Arm, Patient Position: Sitting, Cuff Size: Normal)   Pulse 91   Temp 98.8 F (37.1 C) (Temporal)   Ht 5' 2.25" (1.581 m)   Wt 139 lb 2 oz (63.1 kg)   LMP 11/27/2010   SpO2 98%   BMI 25.24 kg/m   Wt Readings from Last 3 Encounters:  01/17/23 139 lb 2 oz (63.1 kg)  11/15/22 137 lb 8 oz (62.4 kg)  05/01/22 130 lb 6 oz (59.1 kg)      Physical Exam Constitutional:      General: She is not in acute distress.    Appearance: Normal appearance. She is well-developed. She is not ill-appearing or toxic-appearing.  HENT:     Head: Normocephalic.     Right Ear: Hearing, tympanic membrane, ear canal and external ear normal. There is impacted cerumen. Tympanic membrane is not erythematous, retracted or bulging.     Left Ear: Hearing, tympanic membrane, ear canal and external ear normal. There is no impacted cerumen. Tympanic membrane is not erythematous, retracted or bulging.     Nose: No mucosal  edema or rhinorrhea.     Right Sinus: No maxillary sinus tenderness or frontal sinus tenderness.     Left Sinus: No maxillary sinus tenderness or frontal sinus tenderness.     Mouth/Throat:     Mouth: Oropharynx is clear and moist and mucous membranes are normal.     Pharynx: Uvula midline.  Eyes:     General: Lids are normal. Lids are everted, no foreign bodies appreciated.     Extraocular Movements: EOM normal.     Conjunctiva/sclera: Conjunctivae normal.     Pupils: Pupils are equal, round, and reactive to light.  Neck:     Thyroid: No thyroid mass or thyromegaly.     Vascular: No carotid bruit.     Trachea: Trachea normal.  Cardiovascular:     Rate and Rhythm: Normal rate and regular rhythm.     Pulses: Normal pulses.     Heart sounds: Normal heart sounds, S1  normal and S2 normal. No murmur heard.    No friction rub. No gallop.  Pulmonary:     Effort: Pulmonary effort is normal. No tachypnea or respiratory distress.     Breath sounds: Normal breath sounds. No decreased breath sounds, wheezing, rhonchi or rales.  Abdominal:     General: Bowel sounds are normal.     Palpations: Abdomen is soft.     Tenderness: There is no abdominal tenderness.  Musculoskeletal:     Cervical back: Normal range of motion and neck supple.  Skin:    General: Skin is warm, dry and intact.     Findings: No rash.  Neurological:     Mental Status: She is alert.  Psychiatric:        Mood and Affect: Mood is not anxious or depressed.        Speech: Speech normal.        Behavior: Behavior normal. Behavior is cooperative.        Thought Content: Thought content normal.        Cognition and Memory: Cognition and memory normal.        Judgment: Judgment normal.       Results for orders placed or performed in visit on 11/15/22  Comprehensive metabolic panel  Result Value Ref Range   Sodium 139 135 - 145 mEq/L   Potassium 4.2 3.5 - 5.1 mEq/L   Chloride 105 96 - 112 mEq/L   CO2 29 19 - 32 mEq/L   Glucose, Bld 82 70 - 99 mg/dL   BUN 16 6 - 23 mg/dL   Creatinine, Ser 1.61 0.40 - 1.20 mg/dL   Total Bilirubin 0.3 0.2 - 1.2 mg/dL   Alkaline Phosphatase 51 39 - 117 U/L   AST 15 0 - 37 U/L   ALT 13 0 - 35 U/L   Total Protein 7.2 6.0 - 8.3 g/dL   Albumin 4.1 3.5 - 5.2 g/dL   GFR 096.04 >54.09 mL/min   Calcium 9.3 8.4 - 10.5 mg/dL  TSH  Result Value Ref Range   TSH 1.03 0.35 - 5.50 uIU/mL  CBC with Differential/Platelet  Result Value Ref Range   WBC 3.9 (L) 4.0 - 10.5 K/uL   RBC 4.28 3.87 - 5.11 Mil/uL   Hemoglobin 13.5 12.0 - 15.0 g/dL   HCT 81.1 91.4 - 78.2 %   MCV 93.7 78.0 - 100.0 fl   MCHC 33.6 30.0 - 36.0 g/dL   RDW 95.6 21.3 - 08.6 %   Platelets 381.0 150.0 - 400.0 K/uL  Neutrophils Relative % 36.4 (L) 43.0 - 77.0 %   Lymphocytes Relative 50.7 (H)  12.0 - 46.0 %   Monocytes Relative 9.9 3.0 - 12.0 %   Eosinophils Relative 1.9 0.0 - 5.0 %   Basophils Relative 1.1 0.0 - 3.0 %   Neutro Abs 1.4 1.4 - 7.7 K/uL   Lymphs Abs 2.0 0.7 - 4.0 K/uL   Monocytes Absolute 0.4 0.1 - 1.0 K/uL   Eosinophils Absolute 0.1 0.0 - 0.7 K/uL   Basophils Absolute 0.0 0.0 - 0.1 K/uL  T4, free  Result Value Ref Range   Free T4 0.86 0.60 - 1.60 ng/dL  T3, free  Result Value Ref Range   T3, Free 3.8 2.3 - 4.2 pg/mL    Assessment and Plan  Impacted cerumen of right ear Assessment & Plan: Cerumen impaction removal via irrigation Performed by : ... Consent for procedure obtained verbally. Bilateral ears lavaged/ irrigated with warm water gently. Pt tolerated procedure well, with no complications. After procedure ears clear of cerumen impaction, with minimal ear canal irritation and redness, no bleeding. TM intact and symptoms improved.       Return if symptoms worsen or fail to improve.   Kerby Nora, MD

## 2023-01-17 NOTE — Assessment & Plan Note (Signed)
Cerumen impaction removal via irrigation Performed by : ... Consent for procedure obtained verbally. Bilateral ears lavaged/ irrigated with warm water gently. Pt tolerated procedure well, with no complications. After procedure ears clear of cerumen impaction, with minimal ear canal irritation and redness, no bleeding. TM intact and symptoms improved.

## 2023-04-12 ENCOUNTER — Encounter: Payer: Self-pay | Admitting: Family Medicine

## 2023-06-19 ENCOUNTER — Encounter

## 2023-07-02 ENCOUNTER — Encounter

## 2023-07-10 ENCOUNTER — Ambulatory Visit
Admission: RE | Admit: 2023-07-10 | Discharge: 2023-07-10 | Disposition: A | Discharge status: Home / Self Care | Source: Ambulatory Visit | Attending: Obstetrics and Gynecology | Admitting: Obstetrics and Gynecology

## 2023-07-10 DIAGNOSIS — Z1231 Encounter for screening mammogram for malignant neoplasm of breast: Secondary | ICD-10-CM | POA: Diagnosis not present

## 2023-07-11 ENCOUNTER — Encounter: Payer: Self-pay | Admitting: Family Medicine

## 2023-07-11 ENCOUNTER — Ambulatory Visit: Admitting: Family Medicine

## 2023-07-11 VITALS — BP 142/86 | HR 82 | Temp 98.2°F | Ht 62.25 in | Wt 139.1 lb

## 2023-07-11 DIAGNOSIS — M79602 Pain in left arm: Secondary | ICD-10-CM

## 2023-07-11 NOTE — Progress Notes (Signed)
 Patient ID: Kristin Hunter, female    DOB: [birth year], 53 y.o.   MRN: [id number]  This visit was conducted in person.  BP (!) 142/86   Pulse 82   Temp 98.2 F (36.8 C) (Oral)   Ht 5' 2.25" (1.581 m)   Wt 139 lb 2 oz (63.1 kg)   LMP 11/27/2010   SpO2 96%   BMI 25.24 kg/m    CC:  Chief Complaint  Patient presents with   Shoulder Pain    C/o L shoulder/arm pain. Started 2 wks ago. Denies injury. Also, wants to discuss newly started supplements.     Subjective:   HPI: Kristin Hunter is a 53 y.o. female presenting on 07/11/2023 for Shoulder Pain (C/o L shoulder/arm pain. Started 2 wks ago. Denies injury. Also, wants to discuss newly started supplements. )   New onset left arm/shoulder pain, intermittent in last 2 weeks.. pain is random, not triggered with neck or arm movement. Occurring every other day, Lasts few hours.  Pain  across top of shoulder with lifting, raising arm.  No neck pain.  Not associated with exertion.  No numbness or weakness in laeft arm.   NO SOB, no edema, no sweating. No CP.  No fever.   No new activity but works in [employer].  No known fall or injury.         Relevant past medical, surgical, family and social history reviewed and updated as indicated. Interim medical history since our last visit reviewed. Allergies and medications reviewed and updated. Outpatient Medications Prior to Visit  Medication Sig Dispense Refill   Aspirin-Salicylamide-Caffeine (BC HEADACHE POWDER PO) Take by mouth daily as needed.     cholecalciferol (VITAMIN D3) 25 MCG (1000 UNIT) tablet Take 1,000 Units by mouth daily.     COD LIVER OIL PO Take 1 Dose by mouth daily.     estradiol (CLIMARA - DOSED IN MG/24 HR) 0.05 mg/24hr patch Place 0.05 mg onto the skin once a week.     lisinopril  (ZESTRIL ) 20 MG tablet Take 1 tablet (20 mg total) by mouth daily. 90 tablet 1   Misc Natural Products (PUMPKIN SEED OIL) CAPS      Multiple Vitamin (MULTI-VITAMINS) TABS Take 1  tablet by mouth daily.     Zinc Citrate (ZINC GUMMY) 11 MG CHEW      BIOTIN PO Take 2 each by mouth daily. Gummies     No facility-administered medications prior to visit.     Per HPI unless specifically indicated in ROS section below Review of Systems  Constitutional:  Negative for fatigue and fever.  HENT:  Negative for congestion.   Eyes:  Negative for pain.  Respiratory:  Negative for cough and shortness of breath.   Cardiovascular:  Negative for chest pain, palpitations and leg swelling.  Gastrointestinal:  Negative for abdominal pain.  Genitourinary:  Negative for dysuria and vaginal bleeding.  Musculoskeletal:  Negative for back pain.  Neurological:  Negative for syncope, light-headedness and headaches.  Psychiatric/Behavioral:  Negative for dysphoric mood.    Objective:  BP (!) 142/86   Pulse 82   Temp 98.2 F (36.8 C) (Oral)   Ht 5' 2.25" (1.581 m)   Wt 139 lb 2 oz (63.1 kg)   LMP 11/27/2010   SpO2 96%   BMI 25.24 kg/m   Wt Readings from Last 3 Encounters:  07/11/23 139 lb 2 oz (63.1 kg)  01/17/23 139 lb 2 oz (63.1 kg)  11/15/22 137  lb 8 oz (62.4 kg)      Physical Exam Constitutional:      General: She is not in acute distress.    Appearance: Normal appearance. She is well-developed. She is not ill-appearing or toxic-appearing.  HENT:     Head: Normocephalic.     Right Ear: Hearing, tympanic membrane, ear canal and external ear normal. Tympanic membrane is not erythematous, retracted or bulging.     Left Ear: Hearing, tympanic membrane, ear canal and external ear normal. Tympanic membrane is not erythematous, retracted or bulging.     Nose: No mucosal edema or rhinorrhea.     Right Sinus: No maxillary sinus tenderness or frontal sinus tenderness.     Left Sinus: No maxillary sinus tenderness or frontal sinus tenderness.     Mouth/Throat:     Pharynx: Uvula midline.  Eyes:     General: Lids are normal. Lids are everted, no foreign bodies appreciated.      Conjunctiva/sclera: Conjunctivae normal.     Pupils: Pupils are equal, round, and reactive to light.  Neck:     Thyroid : No thyroid  mass or thyromegaly.     Vascular: No carotid bruit.     Trachea: Trachea normal.  Cardiovascular:     Rate and Rhythm: Normal rate and regular rhythm.     Pulses: Normal pulses.     Heart sounds: Normal heart sounds, S1 normal and S2 normal. No murmur heard.    No friction rub. No gallop.  Pulmonary:     Effort: Pulmonary effort is normal. No tachypnea or respiratory distress.     Breath sounds: Normal breath sounds. No decreased breath sounds, wheezing, rhonchi or rales.  Abdominal:     General: Bowel sounds are normal.     Palpations: Abdomen is soft.     Tenderness: There is no abdominal tenderness.  Musculoskeletal:     Right shoulder: Tenderness present. No bony tenderness. Normal range of motion.     Left shoulder: Tenderness present. No bony tenderness. Decreased range of motion.     Right upper arm: Normal.     Left upper arm: Tenderness present.     Cervical back: Normal range of motion and neck supple.     Comments: Neg Spurling, drop arm and Neer's bilaterall  Skin:    General: Skin is warm and dry.     Findings: No rash.  Neurological:     Mental Status: She is alert.  Psychiatric:        Mood and Affect: Mood is not anxious or depressed.        Speech: Speech normal.        Behavior: Behavior normal. Behavior is cooperative.        Thought Content: Thought content normal.        Judgment: Judgment normal.       Results for orders placed or performed in visit on 11/15/22  Comprehensive metabolic panel   Collection Time: 11/15/22 12:28 PM  Result Value Ref Range   Sodium 139 135 - 145 mEq/L   Potassium 4.2 3.5 - 5.1 mEq/L   Chloride 105 96 - 112 mEq/L   CO2 29 19 - 32 mEq/L   Glucose, Bld 82 70 - 99 mg/dL   BUN 16 6 - 23 mg/dL   Creatinine, Ser 1.61 0.40 - 1.20 mg/dL   Total Bilirubin 0.3 0.2 - 1.2 mg/dL   Alkaline  Phosphatase 51 39 - 117 U/L   AST 15 0 - 37 U/L  ALT 13 0 - 35 U/L   Total Protein 7.2 6.0 - 8.3 g/dL   Albumin 4.1 3.5 - 5.2 g/dL   GFR 161.09 >60.45 mL/min   Calcium 9.3 8.4 - 10.5 mg/dL  TSH   Collection Time: 11/15/22 12:28 PM  Result Value Ref Range   TSH 1.03 0.35 - 5.50 uIU/mL  CBC with Differential/Platelet   Collection Time: 11/15/22 12:28 PM  Result Value Ref Range   WBC 3.9 (L) 4.0 - 10.5 K/uL   RBC 4.28 3.87 - 5.11 Mil/uL   Hemoglobin 13.5 12.0 - 15.0 g/dL   HCT 40.9 81.1 - 91.4 %   MCV 93.7 78.0 - 100.0 fl   MCHC 33.6 30.0 - 36.0 g/dL   RDW 78.2 95.6 - 21.3 %   Platelets 381.0 150.0 - 400.0 K/uL   Neutrophils Relative % 36.4 (L) 43.0 - 77.0 %   Lymphocytes Relative 50.7 (H) 12.0 - 46.0 %   Monocytes Relative 9.9 3.0 - 12.0 %   Eosinophils Relative 1.9 0.0 - 5.0 %   Basophils Relative 1.1 0.0 - 3.0 %   Neutro Abs 1.4 1.4 - 7.7 K/uL   Lymphs Abs 2.0 0.7 - 4.0 K/uL   Monocytes Absolute 0.4 0.1 - 1.0 K/uL   Eosinophils Absolute 0.1 0.0 - 0.7 K/uL   Basophils Absolute 0.0 0.0 - 0.1 K/uL  T4, free   Collection Time: 11/15/22 12:28 PM  Result Value Ref Range   Free T4 0.86 0.60 - 1.60 ng/dL  T3, free   Collection Time: 11/15/22 12:28 PM  Result Value Ref Range   T3, Free 3.8 2.3 - 4.2 pg/mL    Assessment and Plan  Left arm pain Assessment & Plan: Acute, most likely musculoskeletal strain.  Patient concerned about cardiac source.  EKG performed in office today: Normal sinus rhythm, no LVH no ST changes, no Q waves. She will continue heat gentle stretches and Tylenol  or ibuprofen as needed for pain.  Orders: -     EKG 12-Lead    Return for annual physical with fasting labs prior.   Herby Lolling, MD

## 2023-07-31 DIAGNOSIS — M79602 Pain in left arm: Secondary | ICD-10-CM | POA: Insufficient documentation

## 2023-07-31 NOTE — Assessment & Plan Note (Signed)
 Acute, most likely musculoskeletal strain.  Patient concerned about cardiac source.  EKG performed in office today: Normal sinus rhythm, no LVH no ST changes, no Q waves. She will continue heat gentle stretches and Tylenol  or ibuprofen as needed for pain.

## 2023-08-18 ENCOUNTER — Other Ambulatory Visit: Payer: Self-pay | Admitting: Family Medicine

## 2023-08-18 DIAGNOSIS — I10 Essential (primary) hypertension: Secondary | ICD-10-CM

## 2023-08-19 NOTE — Telephone Encounter (Signed)
 Please call and schedule CPE with fasting labs prior with Dr. Ermalene Searing.

## 2023-08-19 NOTE — Telephone Encounter (Signed)
 schedule CPE with fasting labs

## 2023-08-20 ENCOUNTER — Telehealth: Payer: Self-pay | Admitting: *Deleted

## 2023-08-20 DIAGNOSIS — I10 Essential (primary) hypertension: Secondary | ICD-10-CM

## 2023-08-20 DIAGNOSIS — Z8639 Personal history of other endocrine, nutritional and metabolic disease: Secondary | ICD-10-CM

## 2023-08-20 NOTE — Telephone Encounter (Signed)
-----   Message from Gerry Krone sent at 08/20/2023 11:05 AM EDT ----- Regarding: Lab orders for Fri, 6.20.25 Patient is scheduled for CPX labs, please order future labs, Thanks , Anselmo Kings

## 2023-08-25 DIAGNOSIS — S29012A Strain of muscle and tendon of back wall of thorax, initial encounter: Secondary | ICD-10-CM | POA: Diagnosis not present

## 2023-08-25 DIAGNOSIS — M545 Low back pain, unspecified: Secondary | ICD-10-CM | POA: Diagnosis not present

## 2023-08-27 ENCOUNTER — Telehealth: Payer: Self-pay

## 2023-08-27 NOTE — Telephone Encounter (Signed)
 Copied from CRM [id number]. Topic: Clinical - Medication Question >> Aug 27, 2023 10:54 AM Clyde Darling P wrote: Reason for CRM: Pt has an appt for labs 06/20 had an episode of severe muscle spasm - went to urgent care on sunday , was prescribe muscle relaxer(methocarbamol 1 tab 4 times a day 500 mg ) and ibroprofen 600 MG , pt would like to know is she good for appt Friday or would she need to reschedule? Pt can be reached [phone]

## 2023-08-28 ENCOUNTER — Ambulatory Visit: Admitting: Internal Medicine

## 2023-08-28 ENCOUNTER — Encounter: Payer: Self-pay | Admitting: Internal Medicine

## 2023-08-28 VITALS — BP 136/84 | HR 86 | Temp 98.3°F | Ht 62.25 in | Wt 137.0 lb

## 2023-08-28 DIAGNOSIS — M62830 Muscle spasm of back: Secondary | ICD-10-CM | POA: Diagnosis not present

## 2023-08-28 DIAGNOSIS — I10 Essential (primary) hypertension: Secondary | ICD-10-CM | POA: Diagnosis not present

## 2023-08-28 LAB — COMPREHENSIVE METABOLIC PANEL WITH GFR
ALT: 10 U/L (ref 0–35)
AST: 13 U/L (ref 0–37)
Albumin: 4.4 g/dL (ref 3.5–5.2)
Alkaline Phosphatase: 46 U/L (ref 39–117)
BUN: 15 mg/dL (ref 6–23)
CO2: 31 meq/L (ref 19–32)
Calcium: 9.3 mg/dL (ref 8.4–10.5)
Chloride: 105 meq/L (ref 96–112)
Creatinine, Ser: 0.67 mg/dL (ref 0.40–1.20)
GFR: 99.71 mL/min (ref >60.00)
Glucose, Bld: 90 mg/dL (ref 70–99)
Potassium: 4.3 meq/L (ref 3.5–5.1)
Sodium: 140 meq/L (ref 135–145)
Total Bilirubin: 0.3 mg/dL (ref 0.2–1.2)
Total Protein: 7.1 g/dL (ref 6.0–8.3)

## 2023-08-28 LAB — LIPID PANEL
Cholesterol: 117 mg/dL (ref 0–200)
HDL: 46.8 mg/dL (ref >39.00)
LDL Cholesterol: 59 mg/dL (ref 0–99)
NonHDL: 70
Total CHOL/HDL Ratio: 2
Triglycerides: 57 mg/dL (ref 0.0–149.0)
VLDL: 11.4 mg/dL (ref 0.0–40.0)

## 2023-08-28 NOTE — Progress Notes (Signed)
 Subjective:    Patient ID: Kristin Hunter, female    DOB: [birth year], 53 y.o.   MRN: [id number]  HPI Here for urgent care follow up  Went to urgent care 3 days ago---had started with severe right mid back muscle spasms the day before Couldn't sleep or ease them off Went in AM to urgent care  Doesn't remember any injury--but sits in chair at home office daily Had noticed some tightness in back in past week or so Has ergonomic chair--but no lumbar support  Has been taking ibuprofen 400mg  --down to twice a day yesterday Methocarbomol bid yesterday also Also alternating heat and cold  She has improved Now able to sleep--uses pillow under back to support back  Current Outpatient Medications on File Prior to Visit  Medication Sig Dispense Refill   Aspirin-Salicylamide-Caffeine (BC HEADACHE POWDER PO) Take by mouth daily as needed.     cholecalciferol (VITAMIN D3) 25 MCG (1000 UNIT) tablet Take 1,000 Units by mouth daily.     COD LIVER OIL PO Take 1 Dose by mouth daily.     estradiol (CLIMARA - DOSED IN MG/24 HR) 0.05 mg/24hr patch Place 0.05 mg onto the skin once a week.     lisinopril  (ZESTRIL ) 20 MG tablet TAKE 1 TABLET BY MOUTH EVERY DAY 30 tablet 0   methocarbamol (ROBAXIN) 500 MG tablet Take 500 mg by mouth every 8 (eight) hours as needed.     Misc Natural Products (PUMPKIN SEED OIL) CAPS      Multiple Vitamin (MULTI-VITAMINS) TABS Take 1 tablet by mouth daily.     Zinc Citrate (ZINC GUMMY) 11 MG CHEW      No current facility-administered medications on file prior to visit.    No Known Allergies  Past Medical History:  Diagnosis Date   COVID 01/2021   Family history of adverse reaction to anesthesia    mother vomiting off balance   Headache    Hypertension    Hyperthyroidism     Past Surgical History:  Procedure Laterality Date   ABDOMINAL HYSTERECTOMY     BREAST BIOPSY Right 11/07/2021   rt br bx distortion, x clip,  radial scar with atypia   BREAST BIOPSY WITH  RADIO FREQUENCY LOCALIZER Right 01/05/2022   Procedure: BREAST BIOPSY WITH RADIO FREQUENCY LOCALIZER;  Surgeon: Eldred Grego, MD;  Location: [facility];  Service: General;  Laterality: Right;   BREAST EXCISIONAL BIOPSY Right 2023   RESIDUAL RADIAL SCAR WITH FOCAL ATYPIA   CHOLECYSTECTOMY     ENDOMETRIAL ABLATION  2010   NASAL TURBINATE REDUCTION Bilateral 11/02/2016   Procedure: TURBINATE REDUCTION/SUBMUCOSAL RESECTION;  Surgeon: Lesly Raspberry, MD;  Location: [facility];  Service: ENT;  Laterality: Bilateral;   PARTIAL HYSTERECTOMY     SEPTOPLASTY Bilateral 11/02/2016   Procedure: SEPTOPLASTY;  Surgeon: Lesly Raspberry, MD;  Location: [facility];  Service: ENT;  Laterality: Bilateral;    Family History  Problem Relation Age of Onset   Hypertension Mother    Thyroid  disease Mother    Hypertension Father    Hypertension Son    Breast cancer Maternal Aunt        late 40's   Heart attack Maternal Grandmother 70   Heart attack Maternal Grandfather 72   Diabetes Maternal Grandfather     Social History   Socioeconomic History   Marital status: Married    Spouse name: Sherrlyn Dolores   Number of children: 2   Years of education: Grad school   Highest education  level: Master's degree (e.g., MA, MS, MEng, MEd, MSW, MBA)  Occupational History   Not on file  Tobacco Use   Smoking status: Never   Smokeless tobacco: Never  Vaping Use   Vaping status: Never Used  Substance and Sexual Activity   Alcohol use: No   Drug use: No   Sexual activity: Yes    Birth control/protection: Surgical  Other Topics Concern   Not on file  Social History Narrative   04/22/19   From: the area   Living: with husband Sherrlyn Dolores    Work:  Interior and spatial designer at [employer]      Family: 2 sons - [place] and [place] - both adults      Enjoys: yoga, walking, watching a movie, reading      Exercise: not as good with the weather, yoga 2-3 times per week   Diet: veggies, eating out a lot  but improving       Safety   Seat belts: Yes    Guns: Yes  and secure   Safe in relationships: Yes    Social Drivers of Health   Financial Resource Strain: Low Risk  (07/10/2023)   Overall Financial Resource Strain (CARDIA)    Difficulty of Paying Living Expenses: Not hard at all  Food Insecurity: No Food Insecurity (07/10/2023)   Hunger Vital Sign    Worried About Running Out of Food in the Last Year: Never true    Ran Out of Food in the Last Year: Never true  Transportation Needs: No Transportation Needs (07/10/2023)   PRAPARE - Administrator, Civil Service (Medical): No    Lack of Transportation (Non-Medical): No  Physical Activity: Insufficiently Active (07/10/2023)   Exercise Vital Sign    Days of Exercise per Week: 2 days    Minutes of Exercise per Session: 60 min  Stress: No Stress Concern Present (07/10/2023)   [employer] of Occupational Health - Occupational Stress Questionnaire    Feeling of Stress : Only a little  Social Connections: Socially Integrated (07/10/2023)   Social Connection and Isolation Panel    Frequency of Communication with Friends and Family: More than three times a week    Frequency of Social Gatherings with Friends and Family: Once a week    Attends Religious Services: More than 4 times per year    Active Member of [employer] or Organizations: Yes    Attends Engineer, structural: More than 4 times per year    Marital Status: Married  Catering manager Violence: Not on file   Review of Systems No GI symptoms with ibuprofen At most slight sedation with methocarbamol     Objective:   Physical Exam Constitutional:      Appearance: Normal appearance.   Musculoskeletal:     Comments: No spine tenderness Fairly normal back flexion Mild tenderness along right low thoracic paraspinals SLR negative Normal ROM in hips   Neurological:     Mental Status: She is alert.     Comments: Normal gait and leg strength           Assessment & Plan:

## 2023-08-28 NOTE — Telephone Encounter (Signed)
 Spoke to pt, [facility] f/u with Dr. Letvak for today, 6/18 @ 11:15am

## 2023-08-28 NOTE — Assessment & Plan Note (Signed)
 Likely related to position sitting in her home office Discussed this Can reduce methocarbamol to bedtime Continue heat and prn ibuprofen

## 2023-08-29 ENCOUNTER — Ambulatory Visit: Payer: Self-pay | Admitting: Family Medicine

## 2023-08-29 NOTE — Progress Notes (Signed)
 No critical labs need to be addressed urgently. We will discuss labs in detail at upcoming office visit.

## 2023-08-30 ENCOUNTER — Other Ambulatory Visit

## 2023-09-03 ENCOUNTER — Ambulatory Visit: Payer: BC Managed Care – PPO | Admitting: Dermatology

## 2023-09-03 ENCOUNTER — Encounter: Payer: Self-pay | Admitting: Dermatology

## 2023-09-03 VITALS — BP 138/90 | HR 77

## 2023-09-03 DIAGNOSIS — L649 Androgenic alopecia, unspecified: Secondary | ICD-10-CM | POA: Diagnosis not present

## 2023-09-03 MED ORDER — SAFETY SEAL MISCELLANEOUS MISC: 1.0000 | Freq: Every morning | 11 refills | Status: AC

## 2023-09-03 NOTE — Patient Instructions (Addendum)
 Date: Tue Sep 03 2023  Hello Elanor,  Thank you for visiting today. Here is a summary of the key instructions:  Diagnosis: Androgenetic Alopecia  - Medications: Apply compounded topical minoxidil w/ finasteride to temples, vertex, and side of scalp every morning   - Use a very thin layer   - Apply in the morning to avoid rubbing on face during sleep  - Supplements:   - Take 2 tablets of Viviscal daily   - Mix Vital Proteins collagen powder with coffee every morning  - Follow-up: Return for follow-up appointment in 4 months. Send a message through MyChart if you have any questions  - Pharmacy: Prescriptions will be sent to [facility]  We look forward to seeing you at your next visit. If you have any questions or concerns before then, please do not hesitate to contact our office.  Warm regards,  Dr. Delon Lenis, Dermatology   Your provider has sent your prescription to [facility] in [place], [place] . A pharmacy representative will call you to confirm details and take your payment information. If you do not receive a call within 24 hours, please contact the pharmacy at [phone] or [phone]. Your unique skincare compound is being formulated in our lab (most compounds take less than 24 hours). Your prescription is shipped vis USPS to your mailbox (2-4 business days). Priority shipping is available at an additional cost. Once received, you will electronically sign/acknowledge that you received your prescription. The pharmacy hours are Monday-Friday 9 am-6 pm EST and Saturday 9 am-1 pm EST.                  Important Information   Due to recent changes in healthcare laws, you may see results of your pathology and/or laboratory studies on MyChart before the doctors have had a chance to review them. We understand that in some cases there may be results that are confusing or concerning to you. Please understand that not all results are received at the same  time and often the doctors may need to interpret multiple results in order to provide you with the best plan of care or course of treatment. Therefore, we ask that you please give us  2 business days to thoroughly review all your results before contacting the office for clarification. Should we see a critical lab result, you will be contacted sooner.     If You Need Anything After Your Visit   If you have any questions or concerns for your doctor, please call our main line at [phone]. If no one answers, please leave a voicemail as directed and we will return your call as soon as possible. Messages left after 4 pm will be answered the following business day.    You may also send us  a message via MyChart. We typically respond to MyChart messages within 1-2 business days.  For prescription refills, please ask your pharmacy to contact our office. Our fax number is [phone].  If you have an urgent issue when the clinic is closed that cannot wait until the next business day, you can page your doctor at the number below.     Please note that while we do our best to be available for urgent issues outside of office hours, we are not available 24/7.    If you have an urgent issue and are unable to reach us , you may choose to seek medical care at your doctor's office, retail clinic, urgent care center, or emergency room.   If you  have a medical emergency, please immediately call 911 or go to the emergency department. In the event of inclement weather, please call our main line at [phone] for an update on the status of any delays or closures.  Dermatology Medication Tips: Please keep the boxes that topical medications come in in order to help keep track of the instructions about where and how to use these. Pharmacies typically print the medication instructions only on the boxes and not directly on the medication tubes.   If your medication is too expensive, please contact our office at  [phone] or send us  a message through MyChart.    We are unable to tell what your co-pay for medications will be in advance as this is different depending on your insurance coverage. However, we may be able to find a substitute medication at lower cost or fill out paperwork to get insurance to cover a needed medication.    If a prior authorization is required to get your medication covered by your insurance company, please allow us  1-2 business days to complete this process.   Drug prices often vary depending on where the prescription is filled and some pharmacies may offer cheaper prices.   The website www.goodrx.com contains coupons for medications through different pharmacies. The prices here do not account for what the cost may be with help from insurance (it may be cheaper with your insurance), but the website can give you the price if you did not use any insurance.  - You can print the associated coupon and take it with your prescription to the pharmacy.  - You may also stop by our office during regular business hours and pick up a GoodRx coupon card.  - If you need your prescription sent electronically to a different pharmacy, notify our office through [facility] or by phone at [phone]

## 2023-09-03 NOTE — Progress Notes (Signed)
   New Patient Visit   Subjective  Kristin Hunter is a 53 y.o. female who presents for the following: Hair Thinning  Patient states she has hair thinning located at the vertex scalp and B/L Temples that she would like to have examined. Patient reports the areas have been there for 8 months. She reports the areas are not bothersome. Patient reports she is currently natural but has used hair dyes and relaxers in the past. Patient reports she has not previously been treated for these areas. Patient reports she is currently taking pumpkin oil supplements and Sacrid hair drops.   The following portions of the chart were reviewed this encounter and updated as appropriate: medications, allergies, medical history  Review of Systems:  No other skin or systemic complaints except as noted in HPI or Assessment and Plan.  Objective  Well appearing patient in no apparent distress; mood and affect are within normal limits.  A focused examination was performed of the following areas: Scalp  Relevant exam findings are noted in the Assessment and Plan.                   Assessment & Plan   ANDROGENETIC ALOPECIA (FEMALE PATTERN HAIR LOSS) Exam: Diffuse thinning of the crown and widening of the midline part with retention of the frontal hairline  Flared  - Assessment:  Diagnosis of androgenetic alopecia based on physical examination, which revealed noticeable thinning of bilateral temples and the vertex of the scalp. Patient reported onset of significant hair thinning approximately one year ago. Underwent partial hysterectomy 10 years prior, with removal of the uterus but retention of ovaries. Patient has been using pumpkin seed oil and oral supplements, which she reports have helped with regrowth. Currently uses natural hair dyes but has discontinued the use of relaxers. Androgenetic alopecia is attributed to a genetic predisposition, causing hair follicles to be sensitive to hormonal changes,  leading to miniaturization and shedding.  Female Androgenic Alopecia is a chronic condition related to genetics and/or hormonal changes.  In women androgenetic alopecia is commonly associated with menopause but may occur any time after puberty.  It causes hair thinning primarily on the crown with widening of the part and temporal hairline recession.  Can use OTC Rogaine (minoxidil) 5% solution/foam as directed.  Oral treatments in female patients who have no contraindication may include : - Low dose oral minoxidil 1.25 - 5mg  daily - Spironolactone 50 - 100mg  bid - Finasteride 2.5 - 5 mg daily Adjunctive therapies include: - Low Level Laser Light Therapy (LLLT) - Platelet-rich plasma injections (PRP) - Hair Transplants or scalp reduction   Treatment Plan: - Prescribed Hormonic Hair Solution to apply every morning - Recommended taking otc supplements to increase hair growth - Plan to follow up in 4 months to re-assess  Long term medication management.  Patient is using long term (months to years) prescription medication  to control their dermatologic condition.  These medications require periodic monitoring to evaluate for efficacy and side effects and may require periodic laboratory monitoring.   ANDROGENETIC ALOPECIA    Return in about 4 months (around 01/03/2024) for Androgentic Alopecia F/U.  I, Jetta Ager, am acting as Neurosurgeon for [employer], DO.  Documentation: I have reviewed the above documentation for accuracy and completeness, and I agree with the above.  Delon Lenis, DO

## 2023-09-06 ENCOUNTER — Encounter: Payer: Self-pay | Admitting: Family Medicine

## 2023-09-06 ENCOUNTER — Ambulatory Visit (INDEPENDENT_AMBULATORY_CARE_PROVIDER_SITE_OTHER): Admitting: Family Medicine

## 2023-09-06 VITALS — BP 120/80 | HR 85 | Temp 98.2°F | Ht 62.0 in | Wt 137.1 lb

## 2023-09-06 DIAGNOSIS — I10 Essential (primary) hypertension: Secondary | ICD-10-CM

## 2023-09-06 DIAGNOSIS — E010 Iodine-deficiency related diffuse (endemic) goiter: Secondary | ICD-10-CM | POA: Diagnosis not present

## 2023-09-06 DIAGNOSIS — L649 Androgenic alopecia, unspecified: Secondary | ICD-10-CM | POA: Insufficient documentation

## 2023-09-06 DIAGNOSIS — Z1211 Encounter for screening for malignant neoplasm of colon: Secondary | ICD-10-CM | POA: Diagnosis not present

## 2023-09-06 DIAGNOSIS — Z0000 Encounter for general adult medical examination without abnormal findings: Secondary | ICD-10-CM

## 2023-09-06 NOTE — Progress Notes (Signed)
 Patient ID: Kristin Hunter, female    DOB: 12/24/1970, 53 y.o.   MRN: [id number]  This visit was conducted in person.  BP 120/80   Pulse 85   Temp 98.2 F (36.8 C) (Temporal)   Ht 5' 2 (1.575 m)   Wt 137 lb 2 oz (62.2 kg)   LMP 11/27/2010   SpO2 97%   BMI 25.08 kg/m    CC:  Chief Complaint  Patient presents with   Annual Exam    Subjective:   HPI: Kristin Hunter is a 53 y.o. female presenting on 09/06/2023 for Annual Exam The patient presents for  complete physical and review of chronic health problems. He/She also has the following acute concerns today:  Recently seen by Dr. Jimmy on August 28, 2023 for spasm of thoracic spine. Symptoms now resolved.  She has been more inactive and sitting.., she feels this triggered it. Better now with home PT.  Hypertension:  Well-controlled on lisinopril  20 mg daily  BP Readings from Last 3 Encounters:  09/06/23 120/80  09/03/23 (!) 138/90  08/28/23 136/84  Using medication without problems or lightheadedness:  none Chest pain with exertion:none Edema:none Short of breath: none Average home BPs: not checking Other issues:  Postmenopausal syndrome: Treated with estradiol patch.. she is trying to wean off over all. S/P hysterectomy for fibroids   She has a lot of stress with work.. causes fatigue at times.  Reviewed labs in detail with patient Lab Results  Component Value Date   CHOL 117 08/28/2023   HDL 46.80 08/28/2023   LDLCALC 59 08/28/2023   LDLDIRECT 63.0 08/12/2017   TRIG 57.0 08/28/2023   CHOLHDL 2 08/28/2023   Exercise: minimal.   Relevant past medical, surgical, family and social history reviewed and updated as indicated. Interim medical history since our last visit reviewed. Allergies and medications reviewed and updated. Outpatient Medications Prior to Visit  Medication Sig Dispense Refill   Aspirin-Salicylamide-Caffeine (BC HEADACHE POWDER PO) Take by mouth daily as needed.     cholecalciferol (VITAMIN  D3) 25 MCG (1000 UNIT) tablet Take 1,000 Units by mouth daily.     COD LIVER OIL PO Take 1 Dose by mouth daily.     estradiol (CLIMARA - DOSED IN MG/24 HR) 0.05 mg/24hr patch Place 0.05 mg onto the skin once a week.     lisinopril  (ZESTRIL ) 20 MG tablet TAKE 1 TABLET BY MOUTH EVERY DAY 30 tablet 0   Misc Natural Products (PUMPKIN SEED OIL) CAPS      Multiple Vitamin (MULTI-VITAMINS) TABS Take 1 tablet by mouth daily.     Safety Seal Miscellaneous MISC Apply 1 Application topically in the morning. Medication Name: Hormonic Hair Solution (Minoxidil 7%, Finasteride 0.05%) 30 mL 11   Zinc Citrate (ZINC GUMMY) 11 MG CHEW      No facility-administered medications prior to visit.     Per HPI unless specifically indicated in ROS section below Review of Systems  Constitutional:  Negative for fatigue and fever.  HENT:  Negative for congestion.   Eyes:  Negative for pain.  Respiratory:  Negative for cough and shortness of breath.   Cardiovascular:  Negative for chest pain, palpitations and leg swelling.  Gastrointestinal:  Negative for abdominal pain.  Genitourinary:  Negative for dysuria and vaginal bleeding.  Musculoskeletal:  Negative for back pain.  Neurological:  Negative for syncope, light-headedness and headaches.  Psychiatric/Behavioral:  Negative for dysphoric mood.    Objective:  BP 120/80   Pulse  85   Temp 98.2 F (36.8 C) (Temporal)   Ht 5' 2 (1.575 m)   Wt 137 lb 2 oz (62.2 kg)   LMP 11/27/2010   SpO2 97%   BMI 25.08 kg/m   Wt Readings from Last 3 Encounters:  09/06/23 137 lb 2 oz (62.2 kg)  08/28/23 137 lb (62.1 kg)  07/11/23 139 lb 2 oz (63.1 kg)      Physical Exam Constitutional:      General: She is not in acute distress.    Appearance: Normal appearance. She is well-developed. She is not ill-appearing or toxic-appearing.  HENT:     Head: Normocephalic.     Right Ear: Hearing, tympanic membrane, ear canal and external ear normal.     Left Ear: Hearing, tympanic  membrane, ear canal and external ear normal.     Nose: Nose normal.   Eyes:     General: Lids are normal. Lids are everted, no foreign bodies appreciated.     Conjunctiva/sclera: Conjunctivae normal.     Pupils: Pupils are equal, round, and reactive to light.   Neck:     Thyroid : Thyromegaly present. No thyroid  mass.     Vascular: No carotid bruit.     Trachea: Trachea normal.   Cardiovascular:     Rate and Rhythm: Normal rate and regular rhythm.     Heart sounds: Normal heart sounds, S1 normal and S2 normal. No murmur heard.    No gallop.  Pulmonary:     Effort: Pulmonary effort is normal. No respiratory distress.     Breath sounds: Normal breath sounds. No wheezing, rhonchi or rales.  Abdominal:     General: Bowel sounds are normal. There is no distension or abdominal bruit.     Palpations: Abdomen is soft. There is no fluid wave or mass.     Tenderness: There is no abdominal tenderness. There is no guarding or rebound.     Hernia: No hernia is present.  Genitourinary:    Exam position: Supine.     Labia:        Right: No rash, tenderness or lesion.        Left: No rash, tenderness or lesion.      Vagina: Normal.     Cervix: No cervical motion tenderness, discharge or friability.     Uterus: Not enlarged and not tender.      Adnexa:        Right: No mass, tenderness or fullness.         Left: No mass, tenderness or fullness.     Musculoskeletal:     Cervical back: Normal range of motion and neck supple.  Lymphadenopathy:     Cervical: No cervical adenopathy.   Skin:    General: Skin is warm and dry.     Findings: No rash.   Neurological:     Mental Status: She is alert.     Cranial Nerves: No cranial nerve deficit.     Sensory: No sensory deficit.   Psychiatric:        Mood and Affect: Mood is not anxious or depressed.        Speech: Speech normal.        Behavior: Behavior normal. Behavior is cooperative.        Judgment: Judgment normal.       Results  for orders placed or performed in visit on 08/28/23  Comprehensive metabolic panel   Collection Time: 08/28/23 11:45 AM  Result Value Ref  Range   Sodium 140 135 - 145 mEq/L   Potassium 4.3 3.5 - 5.1 mEq/L   Chloride 105 96 - 112 mEq/L   CO2 31 19 - 32 mEq/L   Glucose, Bld 90 70 - 99 mg/dL   BUN 15 6 - 23 mg/dL   Creatinine, Ser 9.32 0.40 - 1.20 mg/dL   Total Bilirubin 0.3 0.2 - 1.2 mg/dL   Alkaline Phosphatase 46 39 - 117 U/L   AST 13 0 - 37 U/L   ALT 10 0 - 35 U/L   Total Protein 7.1 6.0 - 8.3 g/dL   Albumin 4.4 3.5 - 5.2 g/dL   GFR 00.28 >39.99 mL/min   Calcium 9.3 8.4 - 10.5 mg/dL  Lipid panel   Collection Time: 08/28/23 11:45 AM  Result Value Ref Range   Cholesterol 117 0 - 200 mg/dL   Triglycerides 42.9 0.0 - 149.0 mg/dL   HDL 53.19 >60.99 mg/dL   VLDL 88.5 0.0 - 59.9 mg/dL   LDL Cholesterol 59 0 - 99 mg/dL   Total CHOL/HDL Ratio 2    NonHDL 70.00     Assessment and Plan  The patient's preventative maintenance and recommended screening tests for an annual wellness exam were reviewed in full today. Brought up to date unless services declined.  Counselled on the importance of diet, exercise, and its role in overall health and mortality. The patient's FH and SH was reviewed, including their home life, tobacco status, and drug and alcohol status.    Referral placed for colonoscopy  Mammo: 07/10/2023 pap done with GYN 2023  Uptodate with td and flu.. discussed shingrix, hep B... she will consider.  No drug use, no ETOH   Nonsmoker.  Routine general medical examination at a health care facility  Primary hypertension Assessment & Plan: Stable, chronic.  Continue current medication.  Lisinopril  20 mg daily   Colon cancer screening -     Ambulatory referral to Gastroenterology  Thyromegaly Assessment & Plan: Re-ordered US  as pt has not scheduled.  Orders: -     US  THYROID ; Future  Androgenic alopecia Assessment & Plan: On minoxidil/finasteride  compunded      Return in about 1 year (around 09/05/2024) for annual physical with fasting labs prior.   Greig Ring, MD

## 2023-09-06 NOTE — Assessment & Plan Note (Signed)
 Re-ordered US  as pt has not scheduled.

## 2023-09-06 NOTE — Assessment & Plan Note (Signed)
 On minoxidil/finasteride compunded

## 2023-09-06 NOTE — Assessment & Plan Note (Signed)
Stable, chronic.  Continue current medication.  Lisinopril 20 mg daily.

## 2023-09-16 ENCOUNTER — Other Ambulatory Visit: Payer: Self-pay | Admitting: Family Medicine

## 2023-09-16 DIAGNOSIS — I10 Essential (primary) hypertension: Secondary | ICD-10-CM

## 2023-10-01 ENCOUNTER — Telehealth: Payer: Self-pay

## 2023-10-01 ENCOUNTER — Other Ambulatory Visit: Payer: Self-pay

## 2023-10-01 DIAGNOSIS — Z1211 Encounter for screening for malignant neoplasm of colon: Secondary | ICD-10-CM

## 2023-10-01 MED ORDER — NA SULFATE-K SULFATE-MG SULF 17.5-3.13-1.6 GM/177ML PO SOLN: 1.0000 | Freq: Once | ORAL | 0 refills | Status: AC

## 2023-10-01 NOTE — Telephone Encounter (Signed)
 Gastroenterology Pre-Procedure Review  Request Date: 12/09/2023 Requesting Physician: Dr. Jinny  PATIENT REVIEW QUESTIONS: The patient responded to the following health history questions as indicated:    1. Are you having any GI issues? no 2. Do you have a personal history of Polyps? no 3. Do you have a family history of Colon Cancer or Polyps? no 4. Diabetes Mellitus? no 5. Joint replacements in the past 12 months?no 6. Major health problems in the past 3 months?no 7. Any artificial heart valves, MVP, or defibrillator?no    MEDICATIONS & ALLERGIES:    Patient reports the following regarding taking any anticoagulation/antiplatelet therapy:   Plavix, Coumadin, Eliquis, Xarelto, Lovenox, Pradaxa, Brilinta, or Effient? no Aspirin? no  Patient confirms/reports the following medications:  Current Outpatient Medications  Medication Sig Dispense Refill   Aspirin-Salicylamide-Caffeine (BC HEADACHE POWDER PO) Take by mouth daily as needed.     cholecalciferol (VITAMIN D3) 25 MCG (1000 UNIT) tablet Take 1,000 Units by mouth daily.     COD LIVER OIL PO Take 1 Dose by mouth daily.     estradiol (CLIMARA - DOSED IN MG/24 HR) 0.05 mg/24hr patch Place 0.05 mg onto the skin once a week.     lisinopril  (ZESTRIL ) 20 MG tablet TAKE 1 TABLET BY MOUTH EVERY DAY 90 tablet 3   Misc Natural Products (PUMPKIN SEED OIL) CAPS      Multiple Vitamin (MULTI-VITAMINS) TABS Take 1 tablet by mouth daily.     Safety Seal Miscellaneous MISC Apply 1 Application topically in the morning. Medication Name: Hormonic Hair Solution (Minoxidil 7%, Finasteride 0.05%) 30 mL 11   Zinc Citrate (ZINC GUMMY) 11 MG CHEW      No current facility-administered medications for this visit.    Patient confirms/reports the following allergies:  No Known Allergies  No orders of the defined types were placed in this encounter.   AUTHORIZATION INFORMATION Primary Insurance: BC[employer] 1D#: Group #:  Secondary Insurance: 1D#: Group  #:  SCHEDULE INFORMATION: Date: December 09, 2023 Time: Location: [facility]

## 2023-10-20 ENCOUNTER — Encounter: Payer: Self-pay | Admitting: Dermatology

## 2023-12-05 ENCOUNTER — Telehealth: Payer: Self-pay

## 2023-12-05 NOTE — Telephone Encounter (Signed)
 Pt has questions in ref to upcoming colonoscopy

## 2023-12-05 NOTE — Telephone Encounter (Signed)
 Pt has been advised that she should stop all vitamins and supplements 5 days prior to her colonoscopy. She stated that she forgot and took her vitamin yesterday.  I advised her to go ahead and stop it today.  She also wanted to make sure that her prep instructions were correct noting to start prep at 5 pm the evening before and then again 5 hours prior to procedure time.  Informed her that this is correct.  Thanks,  [place], CMA

## 2023-12-09 ENCOUNTER — Encounter: Payer: Self-pay | Admitting: Gastroenterology

## 2023-12-09 ENCOUNTER — Encounter
Admission: RE | Disposition: A | Discharge status: Home / Self Care | Payer: Self-pay | Source: Home / Self Care | Attending: Gastroenterology

## 2023-12-09 ENCOUNTER — Telehealth: Payer: Self-pay

## 2023-12-09 ENCOUNTER — Ambulatory Visit
Admission: RE | Admit: 2023-12-09 | Discharge: 2023-12-09 | Disposition: A | Discharge status: Home / Self Care | Attending: Gastroenterology | Admitting: Gastroenterology

## 2023-12-09 ENCOUNTER — Ambulatory Visit: Admitting: Anesthesiology

## 2023-12-09 DIAGNOSIS — E0590 Thyrotoxicosis, unspecified without thyrotoxic crisis or storm: Secondary | ICD-10-CM | POA: Insufficient documentation

## 2023-12-09 DIAGNOSIS — Z1211 Encounter for screening for malignant neoplasm of colon: Secondary | ICD-10-CM | POA: Diagnosis not present

## 2023-12-09 DIAGNOSIS — K640 First degree hemorrhoids: Secondary | ICD-10-CM | POA: Diagnosis not present

## 2023-12-09 DIAGNOSIS — Z79899 Other long term (current) drug therapy: Secondary | ICD-10-CM | POA: Diagnosis not present

## 2023-12-09 DIAGNOSIS — I10 Essential (primary) hypertension: Secondary | ICD-10-CM | POA: Insufficient documentation

## 2023-12-09 DIAGNOSIS — K635 Polyp of colon: Secondary | ICD-10-CM | POA: Diagnosis not present

## 2023-12-09 DIAGNOSIS — R519 Headache, unspecified: Secondary | ICD-10-CM | POA: Diagnosis not present

## 2023-12-09 DIAGNOSIS — K5730 Diverticulosis of large intestine without perforation or abscess without bleeding: Secondary | ICD-10-CM | POA: Diagnosis not present

## 2023-12-09 HISTORY — PX: COLONOSCOPY: SHX5424

## 2023-12-09 HISTORY — PX: POLYPECTOMY: SHX149

## 2023-12-09 SURGERY — COLONOSCOPY
Anesthesia: General

## 2023-12-09 MED ORDER — PROPOFOL 10 MG/ML IV BOLUS
INTRAVENOUS | Status: DC | PRN
  Administered 2023-12-09: 30 mg via INTRAVENOUS
  Administered 2023-12-09: 50 mg via INTRAVENOUS

## 2023-12-09 MED ORDER — LIDOCAINE HCL (CARDIAC) PF 100 MG/5ML IV SOSY
PREFILLED_SYRINGE | INTRAVENOUS | Status: DC | PRN
  Administered 2023-12-09: 60 mg via INTRAVENOUS

## 2023-12-09 MED ORDER — DEXMEDETOMIDINE HCL IN NACL 80 MCG/20ML IV SOLN
INTRAVENOUS | Status: DC | PRN
  Administered 2023-12-09: 12 ug via INTRAVENOUS
  Administered 2023-12-09: 8 ug via INTRAVENOUS

## 2023-12-09 MED ORDER — LIDOCAINE HCL (PF) 2 % IJ SOLN
INTRAMUSCULAR | Status: AC
  Filled 2023-12-09: qty 5

## 2023-12-09 MED ORDER — SODIUM CHLORIDE 0.9 % IV SOLN
INTRAVENOUS | Status: DC
  Administered 2023-12-09: 500 mL via INTRAVENOUS

## 2023-12-09 MED ORDER — PROPOFOL 500 MG/50ML IV EMUL
INTRAVENOUS | Status: DC | PRN
  Administered 2023-12-09: 75 ug/kg/min via INTRAVENOUS

## 2023-12-09 MED ORDER — EPHEDRINE 5 MG/ML INJ
INTRAVENOUS | Status: AC
  Filled 2023-12-09: qty 5

## 2023-12-09 NOTE — Op Note (Signed)
 [facility] Gastroenterology Patient Name: Julieth Tugman Procedure Date: 12/09/2023 8:56 AM MRN: [id number] Account #: [id number] Date of Birth: Jul 08, 1970 Admit Type: Outpatient Age: 53 Room: [facility] ENDO ROOM 4 Gender: Female Note Status: Finalized Instrument Name: Colon Scope [id number] Procedure:             Colonoscopy Indications:           Screening for colorectal malignant neoplasm Providers:             Rogelia Copping MD, MD Medicines:             Propofol  per Anesthesia Complications:         No immediate complications. Procedure:             Pre-Anesthesia Assessment:                        - Prior to the procedure, a History and Physical was                         performed, and patient medications and allergies were                         reviewed. The patient's tolerance of previous                         anesthesia was also reviewed. The risks and benefits                         of the procedure and the sedation options and risks                         were discussed with the patient. All questions were                         answered, and informed consent was obtained. Prior                         Anticoagulants: The patient has taken no anticoagulant                         or antiplatelet agents. ASA Grade Assessment: II - A                         patient with mild systemic disease. After reviewing                         the risks and benefits, the patient was deemed in                         satisfactory condition to undergo the procedure.                        After obtaining informed consent, the colonoscope was                         passed under direct vision. Throughout the procedure,                         the patient's  blood pressure, pulse, and oxygen                         saturations were monitored continuously. The                         Colonoscope was introduced through the anus and                         advanced to the the  cecum, identified by appendiceal                         orifice and ileocecal valve. The colonoscopy was                         performed without difficulty. The patient tolerated                         the procedure well. The quality of the bowel                         preparation was excellent. Findings:      The perianal and digital rectal examinations were normal.      Two sessile polyps were found in the sigmoid colon. The polyps were 2 to       3 mm in size. These polyps were removed with a cold snare. Resection and       retrieval were complete.      Many small-mouthed diverticula were found in the sigmoid colon.      Non-bleeding internal hemorrhoids were found during retroflexion. The       hemorrhoids were Grade I (internal hemorrhoids that do not prolapse). Impression:            - Two 2 to 3 mm polyps in the sigmoid colon, removed                         with a cold snare. Resected and retrieved.                        - Diverticulosis in the sigmoid colon.                        - Non-bleeding internal hemorrhoids. Recommendation:        - Discharge patient to home.                        - Resume previous diet.                        - Continue present medications.                        - If the pathology report reveals adenomatous tissue,                         then repeat the colonoscopy for surveillance in 7                         years. Procedure Code(s):     --- Professional ---  54614, Colonoscopy, flexible; with removal of                         tumor(s), polyp(s), or other lesion(s) by snare                         technique Diagnosis Code(s):     --- Professional ---                        Z12.11, Encounter for screening for malignant neoplasm                         of colon                        D12.5, Benign neoplasm of sigmoid colon CPT copyright 2022 American Medical Association. All rights reserved. The codes documented in  this report are preliminary and upon coder review may  be revised to meet current compliance requirements. Rogelia Copping MD, MD 12/09/2023 9:23:08 AM This report has been signed electronically. Number of Addenda: 0 Note Initiated On: 12/09/2023 8:56 AM Scope Withdrawal Time: 0 hours 10 minutes 29 seconds  Total Procedure Duration: 0 hours 13 minutes 3 seconds  Estimated Blood Loss:  Estimated blood loss: none.      [facility]

## 2023-12-09 NOTE — Telephone Encounter (Signed)
 Kristin Hunter

## 2023-12-09 NOTE — Telephone Encounter (Signed)
 Unable to reach patient by phone and left v/m requesting call back at [phone]. Pt is scheduled for colonoscopy today. [facility] can triage pt./ sending note to [facility] triage.

## 2023-12-09 NOTE — H&P (Signed)
 Rogelia Copping, MD [facility] [address] [place], [place] [address] Phone: [phone] Fax : [phone]  Primary Care Physician:  Avelina Greig BRAVO, MD Primary Gastroenterologist:  Dr. Copping  Pre-Procedure History & Physical: HPI:  Kristin Hunter is a 53 y.o. female is here for a screening colonoscopy.   Past Medical History:  Diagnosis Date   COVID 01/2021   Family history of adverse reaction to anesthesia    mother vomiting off balance   Headache    Hypertension    Hyperthyroidism     Past Surgical History:  Procedure Laterality Date   ABDOMINAL HYSTERECTOMY     BREAST BIOPSY Right 11/07/2021   rt br bx distortion, x clip,  radial scar with atypia   BREAST BIOPSY WITH RADIO FREQUENCY LOCALIZER Right 01/05/2022   Procedure: BREAST BIOPSY WITH RADIO FREQUENCY LOCALIZER;  Surgeon: Rodolph Romano, MD;  Location: [facility];  Service: General;  Laterality: Right;   BREAST EXCISIONAL BIOPSY Right 2023   RESIDUAL RADIAL SCAR WITH FOCAL ATYPIA   CHOLECYSTECTOMY     ENDOMETRIAL ABLATION  2010   NASAL TURBINATE REDUCTION Bilateral 11/02/2016   Procedure: TURBINATE REDUCTION/SUBMUCOSAL RESECTION;  Surgeon: Herminio Miu, MD;  Location: [facility];  Service: ENT;  Laterality: Bilateral;   PARTIAL HYSTERECTOMY     SEPTOPLASTY Bilateral 11/02/2016   Procedure: SEPTOPLASTY;  Surgeon: Herminio Miu, MD;  Location: [facility];  Service: ENT;  Laterality: Bilateral;    Prior to Admission medications   Medication Sig Start Date End Date Taking? Authorizing Provider  Aspirin-Salicylamide-Caffeine (BC HEADACHE POWDER PO) Take by mouth daily as needed.    [provider]  cholecalciferol (VITAMIN D3) 25 MCG (1000 UNIT) tablet Take 1,000 Units by mouth daily. 03/13/23   [provider]  COD LIVER OIL PO Take 1 Dose by mouth daily.    [provider]  Collagenase POWD by Does not apply route.    [provider]  estradiol  (CLIMARA - DOSED IN MG/24 HR) 0.05 mg/24hr patch Place 0.05 mg onto the skin once a week. 11/24/21   [provider]  lisinopril  (ZESTRIL ) 20 MG tablet TAKE 1 TABLET BY MOUTH EVERY DAY 09/16/23   Avelina Greig BRAVO, MD  Misc Natural Products (PUMPKIN SEED OIL) CAPS  04/15/23   [provider]  Multiple Vitamin (MULTI-VITAMINS) TABS Take 1 tablet by mouth daily.    [provider]  Safety Seal Miscellaneous MISC Apply 1 Application topically in the morning. Medication Name: Hormonic Hair Solution (Minoxidil 7%, Finasteride 0.05%) 09/03/23   Alm Delon SAILOR, DO  Zinc Citrate (ZINC GUMMY) 11 MG CHEW  04/13/23   [provider]    Allergies as of 10/01/2023   (No Known Allergies)    Family History  Problem Relation Age of Onset   Hypertension Mother    Thyroid  disease Mother    Hypertension Father    Hypertension Son    Breast cancer Maternal Aunt        late 40's   Heart attack Maternal Grandmother 70   Heart attack Maternal Grandfather 72   Diabetes Maternal Grandfather     Social History   Socioeconomic History   Marital status: Married    Spouse name: Randine   Number of children: 2   Years of education: Grad school   Highest education level: Master's degree (e.g., MA, MS, MEng, MEd, MSW, MBA)  Occupational History   Not on file  Tobacco Use   Smoking status: Never  Passive exposure: Never   Smokeless tobacco: Never  Vaping Use   Vaping status: Never Used  Substance and Sexual Activity   Alcohol use: No   Drug use: No   Sexual activity: Yes    Birth control/protection: Surgical  Other Topics Concern   Not on file  Social History Narrative   04/22/19   From: the area   Living: with husband Randine    Work:  Interior and spatial designer at [employer]      Family: 2 sons - [place] and [place] - both adults      Enjoys: yoga, walking, watching a movie, reading      Exercise: not as good with the weather, yoga 2-3 times per week   Diet: veggies, eating out a lot  but  improving      Safety   Seat belts: Yes    Guns: Yes  and secure   Safe in relationships: Yes    Social Drivers of Health   Financial Resource Strain: Low Risk  (07/10/2023)   Overall Financial Resource Strain (CARDIA)    Difficulty of Paying Living Expenses: Not hard at all  Food Insecurity: No Food Insecurity (07/10/2023)   Hunger Vital Sign    Worried About Running Out of Food in the Last Year: Never true    Ran Out of Food in the Last Year: Never true  Transportation Needs: No Transportation Needs (07/10/2023)   PRAPARE - Administrator, Civil Service (Medical): No    Lack of Transportation (Non-Medical): No  Physical Activity: Insufficiently Active (07/10/2023)   Exercise Vital Sign    Days of Exercise per Week: 2 days    Minutes of Exercise per Session: 60 min  Stress: No Stress Concern Present (07/10/2023)   [employer] of Occupational Health - Occupational Stress Questionnaire    Feeling of Stress : Only a little  Social Connections: Socially Integrated (07/10/2023)   Social Connection and Isolation Panel    Frequency of Communication with Friends and Family: More than three times a week    Frequency of Social Gatherings with Friends and Family: Once a week    Attends Religious Services: More than 4 times per year    Active Member of [employer] or Organizations: Yes    Attends Engineer, structural: More than 4 times per year    Marital Status: Married  Catering manager Violence: Not on file    Review of Systems: See HPI, otherwise negative ROS  Physical Exam: LMP 11/27/2010  General:   Alert,  pleasant and cooperative in NAD Head:  Normocephalic and atraumatic. Neck:  Supple; no masses or thyromegaly. Lungs:  Clear throughout to auscultation.    Heart:  Regular rate and rhythm. Abdomen:  Soft, nontender and nondistended. Normal bowel sounds, without guarding, and without rebound.   Neurologic:  Alert and  oriented x4;  grossly normal  neurologically.  Impression/Plan: Kristin Hunter is now here to undergo a screening colonoscopy.  Risks, benefits, and alternatives regarding colonoscopy have been reviewed with the patient.  Questions have been answered.  All parties agreeable.

## 2023-12-09 NOTE — Anesthesia Postprocedure Evaluation (Signed)
 Anesthesia Post Note  Patient: Kristin Hunter  Procedure(s) Performed: COLONOSCOPY POLYPECTOMY, INTESTINE  Patient location during evaluation: PACU Anesthesia Type: General Level of consciousness: awake and awake and alert Pain management: satisfactory to patient Respiratory status: spontaneous breathing Cardiovascular status: blood pressure returned to baseline Anesthetic complications: no   No notable events documented.   Last Vitals:  Vitals:   12/09/23 0936 12/09/23 0946  BP: 92/62 108/78  Pulse: 95   Resp: 19 18  Temp:    SpO2: 100% 100%    Last Pain:  Vitals:   12/09/23 0926  TempSrc: Temporal  PainSc:                  VAN STAVEREN,Phoenicia Pirie

## 2023-12-09 NOTE — Telephone Encounter (Signed)
 I spoke with pt; prod cough with clear to yellow phlegm and when has deep cough hurts on rt side of chest pt does not have severe congestion, scrathy throat is better and n o wheezing no SOB or Fever, pt said Dr Jinny said if no fever and no SOB and 2 neg covid test pt could have the colonoscopy.  Pt is taking mucinex. Pt scheduled appt with ONEIDA Patrick FNP on 12/10/23 at 12 noon with UC & ED precautions.  [place] note to ONEIDA Patrick FNP who is out of office and CHRISTELLA Crandall NP who is in office.

## 2023-12-09 NOTE — Anesthesia Preprocedure Evaluation (Signed)
 Anesthesia Evaluation  Patient identified by MRN, date of birth, ID band Patient awake    Reviewed: Allergy & Precautions, NPO status , Patient's Chart, lab work & pertinent test results  Airway Mallampati: II  TM Distance: <3 FB Neck ROM: full    Dental  (+) Teeth Intact   Pulmonary neg pulmonary ROS   Pulmonary exam normal breath sounds clear to auscultation       Cardiovascular Exercise Tolerance: Good hypertension, Pt. on medications negative cardio ROS Normal cardiovascular exam Rhythm:Regular Rate:Normal     Neuro/Psych  Headaches negative neurological ROS  negative psych ROS   GI/Hepatic negative GI ROS, Neg liver ROS,,,  Endo/Other  negative endocrine ROS Hyperthyroidism   Renal/GU negative Renal ROS  negative genitourinary   Musculoskeletal negative musculoskeletal ROS (+)    Abdominal Normal abdominal exam  (+)   Peds negative pediatric ROS (+)  Hematology negative hematology ROS (+)   Anesthesia Other Findings Past Medical History: 01/2021: COVID No date: Family history of adverse reaction to anesthesia     Comment:  mother vomiting off balance No date: Headache No date: Hypertension No date: Hyperthyroidism  Past Surgical History: No date: ABDOMINAL HYSTERECTOMY 11/07/2021: BREAST BIOPSY; Right     Comment:  rt br bx distortion, x clip,  radial scar with atypia 01/05/2022: BREAST BIOPSY WITH RADIO FREQUENCY LOCALIZER; Right     Comment:  Procedure: BREAST BIOPSY WITH RADIO FREQUENCY LOCALIZER;              Surgeon: Rodolph Romano, MD;  Location: [facility];               Service: General;  Laterality: Right; 2023: BREAST EXCISIONAL BIOPSY; Right     Comment:  RESIDUAL RADIAL SCAR WITH FOCAL ATYPIA No date: CHOLECYSTECTOMY 2010: ENDOMETRIAL ABLATION 11/02/2016: NASAL TURBINATE REDUCTION; Bilateral     Comment:  Procedure: TURBINATE REDUCTION/SUBMUCOSAL RESECTION;                 Surgeon: Herminio Miu, MD;  Location: [facility];  Service: ENT;  Laterality: Bilateral; No date: PARTIAL HYSTERECTOMY 11/02/2016: SEPTOPLASTY; Bilateral     Comment:  Procedure: SEPTOPLASTY;  Surgeon: Herminio Miu, MD;               Location: [facility];  Service: ENT;                Laterality: Bilateral;  BMI    Body Mass Index: 24.91 kg/m      Reproductive/Obstetrics negative OB ROS                              Anesthesia Physical Anesthesia Plan  ASA: 2  Anesthesia Plan: General   Post-op Pain Management:    Induction:   PONV Risk Score and Plan: Propofol  infusion and TIVA  Airway Management Planned: Natural Airway and Nasal Cannula  Additional Equipment:   Intra-op Plan:   Post-operative Plan:   Informed Consent: I have reviewed the patients History and Physical, chart, labs and discussed the procedure including the risks, benefits and alternatives for the proposed anesthesia with the patient or authorized representative who has indicated his/her understanding and acceptance.     Dental Advisory Given  Plan Discussed with: CRNA  Anesthesia Plan Comments:         Anesthesia Quick Evaluation

## 2023-12-09 NOTE — Transfer of Care (Signed)
 Immediate Anesthesia Transfer of Care Note  Patient: Kristin Hunter  Procedure(s) Performed: COLONOSCOPY POLYPECTOMY, INTESTINE  Patient Location: PACU  Anesthesia Type:General  Level of Consciousness: sedated  Airway & Oxygen Therapy: Patient Spontanous Breathing  Post-op Assessment: Report given to RN and Post -op Vital signs reviewed and stable  Post vital signs: Reviewed and stable  Last Vitals:  Vitals Value Taken Time  BP    Temp    Pulse 95 12/09/23 09:26  Resp 17 12/09/23 09:26  SpO2 98 % 12/09/23 09:26  Vitals shown include unfiled device data.  Last Pain:  Vitals:   12/09/23 0836  TempSrc: Temporal  PainSc:          Complications: No notable events documented.

## 2023-12-10 ENCOUNTER — Ambulatory Visit: Admitting: Family

## 2023-12-10 ENCOUNTER — Encounter: Payer: Self-pay | Admitting: Family

## 2023-12-10 ENCOUNTER — Ambulatory Visit: Payer: Self-pay | Admitting: Gastroenterology

## 2023-12-10 VITALS — BP 142/82 | HR 92 | Temp 98.1°F | Ht 62.0 in | Wt 140.0 lb

## 2023-12-10 DIAGNOSIS — J22 Unspecified acute lower respiratory infection: Secondary | ICD-10-CM

## 2023-12-10 LAB — SURGICAL PATHOLOGY

## 2023-12-10 MED ORDER — AMOXICILLIN-POT CLAVULANATE 875-125 MG PO TABS: 1.0000 | ORAL_TABLET | Freq: Two times a day (BID) | ORAL | 0 refills | Status: AC

## 2023-12-10 NOTE — Telephone Encounter (Signed)
 NOTED

## 2023-12-10 NOTE — Progress Notes (Signed)
 Established Patient Office Visit  Subjective:      CC:  Chief Complaint  Patient presents with   Acute Visit    Reports increased chest congestion, cough, scratchy throat. Took 2 at home COVID tests and they were both negative.    HPI: Kristin Hunter is a 53 y.o. female presenting on 12/10/2023 for Acute Visit (Reports increased chest congestion, cough, scratchy throat. Took 2 at home COVID tests and they were both negative.) .  Discussed the use of AI scribe software for clinical note transcription with the patient, who gave verbal consent to proceed.  History of Present Illness Kristin Hunter is a 53 year old female who presents with a cough and chest discomfort.  Symptoms began five days ago with a scratchy throat, which has since improved. She developed a cough causing a burning sensation in her chest and producing yellow phlegm. No shortness of breath or wheezing. She experienced chills and body aches, which have improved.  She has been taking Mucinex since yesterday, which provides some relief, and used a humidifier last night, offering significant relief. Despite these measures, she feels worse today than on previous days.  No history of asthma and no known medication allergies. She has previously taken Augmentin  without issues.  She has a history of an enlarged thyroid  and is scheduled for an ultrasound, which has not yet been completed.         Social history:  Relevant past medical, surgical, family and social history reviewed and updated as indicated. Interim medical history since our last visit reviewed.  Allergies and medications reviewed and updated.  DATA REVIEWED: CHART IN EPIC     ROS: Negative unless specifically indicated above in HPI.    Current Outpatient Medications:    amoxicillin -clavulanate (AUGMENTIN ) 875-125 MG tablet, Take 1 tablet by mouth 2 (two) times daily., Disp: 20 tablet, Rfl: 0   Aspirin-Salicylamide-Caffeine (BC HEADACHE  POWDER PO), Take by mouth daily as needed., Disp: , Rfl:    cholecalciferol (VITAMIN D3) 25 MCG (1000 UNIT) tablet, Take 1,000 Units by mouth daily., Disp: , Rfl:    COD LIVER OIL PO, Take 1 Dose by mouth daily., Disp: , Rfl:    Collagenase POWD, by Does not apply route., Disp: , Rfl:    estradiol (CLIMARA - DOSED IN MG/24 HR) 0.05 mg/24hr patch, Place 0.05 mg onto the skin once a week., Disp: , Rfl:    lisinopril  (ZESTRIL ) 20 MG tablet, TAKE 1 TABLET BY MOUTH EVERY DAY, Disp: 90 tablet, Rfl: 3   Misc Natural Products (PUMPKIN SEED OIL) CAPS, , Disp: , Rfl:    Multiple Vitamin (MULTI-VITAMINS) TABS, Take 1 tablet by mouth daily., Disp: , Rfl:    Safety Seal Miscellaneous MISC, Apply 1 Application topically in the morning. Medication Name: Hormonic Hair Solution (Minoxidil 7%, Finasteride 0.05%), Disp: 30 mL, Rfl: 11   Zinc Citrate (ZINC GUMMY) 11 MG CHEW, , Disp: , Rfl:         Objective:        BP (!) 142/82 (BP Location: Left Arm, Patient Position: Sitting, Cuff Size: Normal)   Pulse 92   Temp 98.1 F (36.7 C) (Temporal)   Ht 5' 2 (1.575 m)   Wt 140 lb (63.5 kg)   LMP 11/27/2010   SpO2 98%   BMI 25.61 kg/m   Physical Exam VITALS: T- 98.1 HEENT: Throat slightly erythematous. NECK: Cervical lymphadenopathy present. Thyromegaly noted.  Wt Readings from Last 3 Encounters:  12/10/23 140  lb (63.5 kg)  12/09/23 136 lb 3.2 oz (61.8 kg)  09/06/23 137 lb 2 oz (62.2 kg)    Physical Exam Vitals reviewed.  Constitutional:      General: She is not in acute distress.    Appearance: Normal appearance. She is normal weight. She is not ill-appearing, toxic-appearing or diaphoretic.  HENT:     Head: Normocephalic.     Right Ear: Tympanic membrane normal.     Left Ear: Tympanic membrane normal.     Nose: Nose normal.     Mouth/Throat:     Mouth: Mucous membranes are dry.     Pharynx: No oropharyngeal exudate or posterior oropharyngeal erythema.  Eyes:     Extraocular Movements:  Extraocular movements intact.     Pupils: Pupils are equal, round, and reactive to light.  Neck:     Thyroid : Thyromegaly present.  Cardiovascular:     Rate and Rhythm: Normal rate and regular rhythm.     Pulses: Normal pulses.     Heart sounds: Normal heart sounds.  Pulmonary:     Effort: Pulmonary effort is normal.     Breath sounds: Normal breath sounds.  Musculoskeletal:     Cervical back: Normal range of motion.  Neurological:     General: No focal deficit present.     Mental Status: She is alert and oriented to person, place, and time. Mental status is at baseline.  Psychiatric:        Mood and Affect: Mood normal.        Behavior: Behavior normal.        Thought Content: Thought content normal.        Judgment: Judgment normal.           Results DIAGNOSTIC Colonoscopy: Completed  Assessment & Plan:   Assessment and Plan Assessment & Plan Acute bronchitis Acute bronchitis with symptoms of scratchy throat, burning chest cough, and yellow phlegm for five days, worsening today. No wheezing or shortness of breath. No fever currently, but chills and body aches were present. Throat redness noted. Viral etiology suspected. - Continue Mucinex to thin mucus and aid expectoration. - Use humidifier to alleviate symptoms. - Prescribe Augmentin  if symptoms worsen or fever develops. - Avoid Nyquil due to potential for increased blood pressure; recommend Mucinex instead.  Hypertension Hypertension managed with Lisinopril . Blood pressure slightly elevated, likely due to current illness. - Continue current antihypertensive medication regimen. - Avoid medications like Nyquil that may increase blood pressure.  Enlarged thyroid  gland Enlarged thyroid  gland noted on examination. Scheduled for an ultrasound, not yet completed.        Return for f/u PCP if no improvement in symptoms.     Ginger Patrick, MSN, APRN, FNP-C [facility]

## 2023-12-10 NOTE — Telephone Encounter (Signed)
 Noted

## 2023-12-23 NOTE — Telephone Encounter (Signed)
 Pt called in checking on the status of her question from 8/10. She states she called and left a vm as well, but did not hear back. Pt is requesting advise, please call at: [phone].

## 2023-12-24 NOTE — Telephone Encounter (Signed)
 I do not recommend any treatments that change the bonds of the hair structure as this many damage and new hair regrowth that it very fragile.  It sounds like she's having a flare of Seb Derm.  Please send rx for fluconazole 150mg  tablet to take today and a again in 1 week, she should was with DHS Zinc shampoo (otc) and send rx of clobetasol to be apply to any itchy areas 1-2 times a day as needed.  She should continue the compound drops that's treating the hair thinning.  Thanks!  Dr Alm

## 2023-12-25 ENCOUNTER — Other Ambulatory Visit: Payer: Self-pay

## 2023-12-25 DIAGNOSIS — L219 Seborrheic dermatitis, unspecified: Secondary | ICD-10-CM

## 2023-12-25 MED ORDER — FLUCONAZOLE 150 MG PO TABS: 150.0000 mg | ORAL_TABLET | ORAL | 4 refills | Status: AC

## 2023-12-25 MED ORDER — CLOBETASOL PROPIONATE 0.05 % EX SOLN: 1.0000 | CUTANEOUS | 6 refills | Status: AC | PRN

## 2024-01-27 ENCOUNTER — Encounter: Payer: Self-pay | Admitting: Dermatology

## 2024-01-27 ENCOUNTER — Ambulatory Visit: Admitting: Dermatology

## 2024-01-27 VITALS — BP 146/81

## 2024-01-27 DIAGNOSIS — L649 Androgenic alopecia, unspecified: Secondary | ICD-10-CM | POA: Diagnosis not present

## 2024-01-27 DIAGNOSIS — L219 Seborrheic dermatitis, unspecified: Secondary | ICD-10-CM | POA: Diagnosis not present

## 2024-01-27 MED ORDER — ZORYVE 0.3 % EX FOAM: 1.0000 | Freq: Every day | CUTANEOUS | 11 refills | Status: AC

## 2024-01-27 NOTE — Progress Notes (Signed)
   Follow-Up Visit   Subjective  Kristin Hunter is a 53 y.o. female established patient who presents for FOLLOW UP on the diagnoses listed below:  Patient was last evaluated on 09/03/23.   AGA: Prescribed Minoxidil 7% Finasteride 0.05% - . Recommended viviscal & vital protein supplement. Patient reports sxs are better - she is seeing regrowth.  SebDerm: She has been using DHS Zinc shampoo every 2 weeks and taken fluconazole 150mg  weekly. Pt has not been able to see a difference. She is still flaking with itch rating 6/10.   Are you nursing, pregnant or trying to conceive? No   The following portions of the chart were reviewed this encounter and updated as appropriate: medications, allergies, medical history  Review of Systems:  No other skin or systemic complaints except as noted in HPI or Assessment and Plan.  Objective  Well appearing patient in no apparent distress; mood and affect are within normal limits.   A focused examination was performed of the following areas: scalp   Relevant exam findings are noted in the Assessment and Plan.              Assessment & Plan    Androgenic alopecia Improving with current topical treatment. Hair regrowth is in the first phase, with expected full results in a year to a year and a half. Continued use of topical treatment is necessary to prevent hair thinning from progressing. Discontinuation for three months or more may lead to recurrence of hair thinning. - Continue current topical treatment regimen. - Provided a new prescription with ten refills. - Advised to apply topical treatment every morning. - Instructed to apply topical treatment to the temples and avoid areas not affected by androgenic alopecia. - Discussed potential switch to oral minoxidil if topical treatment is ineffective, with risks including unwanted facial hair growth and lightheadedness.  Seborrheic dermatitis Chronic seborrheic dermatitis with recurrent  itchiness and flaking, primarily on the scalp. Previous treatment with clobetasol was ineffective. Differential diagnosis includes early psoriasis, but current presentation does not support this. Zoryve foam is recommended for its non-steroidal properties and long half-life, allowing for effective management of symptoms and prevention of recurrence. - Prescribed Zoryve foam for daily use until symptoms resolve, then twice weekly for prevention. - Instructed to apply Zoryve foam to affected areas, including the scalp and any facial flares. - Continue using zinc shampoo. - Monitor for insurance coverage and adjust plan if necessary. SEBORRHEIC DERMATITIS   Related Medications fluconazole (DIFLUCAN) 150 MG tablet Take 1 tablet (150 mg total) by mouth once a week. clobetasol (TEMOVATE) 0.05 % external solution Apply 1 Application topically as needed. Apply as needed for excessive itching on scalp Roflumilast (ZORYVE) 0.3 % FOAM Apply 1 Application topically daily. Use on scalp until clear. Once clear use 1-2 times a week for maintenance.  No follow-ups on file.   Documentation: I have reviewed the above documentation for accuracy and completeness, and I agree with the above.  I, Shirron Maranda, CMA, am acting as scribe for [employer], DO.   Delon Lenis, DO

## 2024-01-27 NOTE — Patient Instructions (Addendum)
 VISIT SUMMARY:  Today, we discussed your ongoing issues with hair loss and scalp itching. You have been experiencing hair loss since June and have been using a topical treatment every morning. You also have scalp itchiness and flaking, particularly in the back of your scalp. We reviewed your current treatments and made some adjustments to better manage your symptoms.  YOUR PLAN:  -ANDROGENIC ALOPECIA:  Androgenic alopecia is a common form of hair loss in both men and women. It is improving with your current topical treatment, and hair regrowth is in the first phase.   You should continue using the topical treatment every morning, focusing on the temples and other affected areas. Avoid applying it to areas not affected by hair thinning.   We provided a new prescription with ten refills.   If the topical treatment is not effective, we may consider switching to oral minoxidil, which has some risks like unwanted facial hair growth and lightheadedness.  -SEBORRHEIC DERMATITIS:  Seborrheic dermatitis is a chronic skin condition that causes itchy and flaky skin, primarily on the scalp.  Your previous treatment with clobetasol was not effective.   We have prescribed Zoryve foam for daily use until your symptoms resolve, then twice weekly for prevention. Apply the foam to the affected areas, including the scalp and any facial flares.   Continue using your zinc shampoo. We will monitor your insurance coverage and adjust the plan if necessary.  INSTRUCTIONS:  Please continue with your current topical treatment for hair loss every morning and apply it only to the affected areas. Use the Zoryve foam daily until your symptoms improve, then switch to twice weekly for maintenance. Keep using your zinc shampoo as well. We will monitor your progress and adjust the treatment plan as needed. If you have any concerns or notice any side effects, please contact our office.   Your prescription was sent to  [facility] in [place]. A representative from [facility] will contact you within 3 business hours to verify your address and insurance information to schedule a free delivery. If for any reason you do not receive a phone call from them, please reach out to them. Their phone number is [phone] and their hours are Monday-Friday 9:00 am-5:00 pm.        Important Information  Due to recent changes in healthcare laws, you may see results of your pathology and/or laboratory studies on MyChart before the doctors have had a chance to review them. We understand that in some cases there may be results that are confusing or concerning to you. Please understand that not all results are received at the same time and often the doctors may need to interpret multiple results in order to provide you with the best plan of care or course of treatment. Therefore, we ask that you please give us  2 business days to thoroughly review all your results before contacting the office for clarification. Should we see a critical lab result, you will be contacted sooner.   If You Need Anything After Your Visit  If you have any questions or concerns for your doctor, please call our main line at [phone] If no one answers, please leave a voicemail as directed and we will return your call as soon as possible. Messages left after 4 pm will be answered the following business day.   You may also send us  a message via MyChart. We typically respond to MyChart messages within 1-2 business days.  For prescription refills, please ask your pharmacy  to contact our office. Our fax number is [phone].  If you have an urgent issue when the clinic is closed that cannot wait until the next business day, you can page your doctor at the number below.    Please note that while we do our best to be available for urgent issues outside of office hours, we are not available 24/7.   If you have an urgent issue and are unable  to reach us , you may choose to seek medical care at your doctor's office, retail clinic, urgent care center, or emergency room.  If you have a medical emergency, please immediately call 911 or go to the emergency department. In the event of inclement weather, please call our main line at [phone] for an update on the status of any delays or closures.  Dermatology Medication Tips: Please keep the boxes that topical medications come in in order to help keep track of the instructions about where and how to use these. Pharmacies typically print the medication instructions only on the boxes and not directly on the medication tubes.   If your medication is too expensive, please contact our office at [phone] or send us  a message through MyChart.   We are unable to tell what your co-pay for medications will be in advance as this is different depending on your insurance coverage. However, we may be able to find a substitute medication at lower cost or fill out paperwork to get insurance to cover a needed medication.   If a prior authorization is required to get your medication covered by your insurance company, please allow us  1-2 business days to complete this process.  Drug prices often vary depending on where the prescription is filled and some pharmacies may offer cheaper prices.  The website www.goodrx.com contains coupons for medications through different pharmacies. The prices here do not account for what the cost may be with help from insurance (it may be cheaper with your insurance), but the website can give you the price if you did not use any insurance.  - You can print the associated coupon and take it with your prescription to the pharmacy.  - You may also stop by our office during regular business hours and pick up a GoodRx coupon card.  - If you need your prescription sent electronically to a different pharmacy, notify our office through [facility] or by phone at  [phone]

## 2024-02-26 DIAGNOSIS — Z79890 Hormone replacement therapy (postmenopausal): Secondary | ICD-10-CM | POA: Diagnosis not present

## 2024-02-26 DIAGNOSIS — N644 Mastodynia: Secondary | ICD-10-CM | POA: Diagnosis not present

## 2025-01-26 ENCOUNTER — Ambulatory Visit: Admitting: Dermatology
